# Patient Record
Sex: Male | Born: 1970 | Race: White | Hispanic: No | Marital: Married | Smoking: Current every day smoker
Health system: Southern US, Community
[De-identification: ages and names within clinical notes are randomized; demographics above are authoritative.]

## PROBLEM LIST (undated history)

## (undated) ENCOUNTER — Emergency Department: Disposition: A | Discharge status: Home / Self Care | Payer: 59

## (undated) DIAGNOSIS — J449 Chronic obstructive pulmonary disease, unspecified: Secondary | ICD-10-CM

## (undated) DIAGNOSIS — T8859XA Other complications of anesthesia, initial encounter: Secondary | ICD-10-CM

## (undated) DIAGNOSIS — E781 Pure hyperglyceridemia: Secondary | ICD-10-CM

## (undated) DIAGNOSIS — T4145XA Adverse effect of unspecified anesthetic, initial encounter: Secondary | ICD-10-CM

## (undated) DIAGNOSIS — Z9889 Other specified postprocedural states: Secondary | ICD-10-CM

## (undated) DIAGNOSIS — G8929 Other chronic pain: Secondary | ICD-10-CM

## (undated) DIAGNOSIS — G839 Paralytic syndrome, unspecified: Secondary | ICD-10-CM

## (undated) DIAGNOSIS — K219 Gastro-esophageal reflux disease without esophagitis: Secondary | ICD-10-CM

## (undated) DIAGNOSIS — R112 Nausea with vomiting, unspecified: Secondary | ICD-10-CM

## (undated) DIAGNOSIS — L409 Psoriasis, unspecified: Secondary | ICD-10-CM

## (undated) DIAGNOSIS — E041 Nontoxic single thyroid nodule: Secondary | ICD-10-CM

## (undated) DIAGNOSIS — M549 Dorsalgia, unspecified: Secondary | ICD-10-CM

## (undated) HISTORY — DX: Paralytic syndrome, unspecified: G83.9

## (undated) HISTORY — DX: Nontoxic single thyroid nodule: E04.1

## (undated) HISTORY — DX: Gastro-esophageal reflux disease without esophagitis: K21.9

## (undated) HISTORY — DX: Psoriasis, unspecified: L40.9

---

## 1993-05-05 HISTORY — PX: JOINT REPLACEMENT: SHX530

## 1993-05-05 HISTORY — PX: COSMETIC SURGERY: SHX468

## 2003-04-18 ENCOUNTER — Ambulatory Visit (HOSPITAL_BASED_OUTPATIENT_CLINIC_OR_DEPARTMENT_OTHER): Admission: RE | Admit: 2003-04-18 | Discharge: 2003-04-18 | Payer: Self-pay | Admitting: Orthopaedic Surgery

## 2003-05-06 HISTORY — PX: ROTATOR CUFF REPAIR: SHX139

## 2005-05-05 HISTORY — PX: CARPAL TUNNEL RELEASE: SHX101

## 2005-08-12 ENCOUNTER — Ambulatory Visit (HOSPITAL_BASED_OUTPATIENT_CLINIC_OR_DEPARTMENT_OTHER): Admission: RE | Admit: 2005-08-12 | Discharge: 2005-08-12 | Payer: Self-pay | Admitting: Orthopaedic Surgery

## 2006-01-31 ENCOUNTER — Emergency Department (HOSPITAL_COMMUNITY): Admission: EM | Admit: 2006-01-31 | Discharge: 2006-01-31 | Payer: Self-pay | Admitting: Emergency Medicine

## 2009-01-29 ENCOUNTER — Emergency Department (HOSPITAL_COMMUNITY): Admission: EM | Admit: 2009-01-29 | Discharge: 2009-01-29 | Payer: Self-pay | Admitting: Emergency Medicine

## 2009-02-05 ENCOUNTER — Ambulatory Visit (HOSPITAL_COMMUNITY): Admission: RE | Admit: 2009-02-05 | Discharge: 2009-02-05 | Payer: Self-pay | Admitting: Pulmonary Disease

## 2009-02-13 ENCOUNTER — Encounter (INDEPENDENT_AMBULATORY_CARE_PROVIDER_SITE_OTHER): Payer: Self-pay | Admitting: *Deleted

## 2009-04-05 ENCOUNTER — Ambulatory Visit: Payer: Self-pay | Admitting: Internal Medicine

## 2009-04-05 DIAGNOSIS — R1032 Left lower quadrant pain: Secondary | ICD-10-CM | POA: Insufficient documentation

## 2009-04-05 DIAGNOSIS — K219 Gastro-esophageal reflux disease without esophagitis: Secondary | ICD-10-CM

## 2009-04-05 DIAGNOSIS — R1012 Left upper quadrant pain: Secondary | ICD-10-CM

## 2009-04-05 DIAGNOSIS — R198 Other specified symptoms and signs involving the digestive system and abdomen: Secondary | ICD-10-CM

## 2009-04-05 DIAGNOSIS — R6881 Early satiety: Secondary | ICD-10-CM

## 2009-04-12 ENCOUNTER — Encounter: Payer: Self-pay | Admitting: Internal Medicine

## 2009-04-13 ENCOUNTER — Encounter: Payer: Self-pay | Admitting: Internal Medicine

## 2009-04-19 ENCOUNTER — Ambulatory Visit (HOSPITAL_COMMUNITY): Admission: RE | Admit: 2009-04-19 | Discharge: 2009-04-19 | Payer: Self-pay | Admitting: Internal Medicine

## 2009-04-19 ENCOUNTER — Ambulatory Visit: Payer: Self-pay | Admitting: Internal Medicine

## 2009-04-19 HISTORY — PX: ESOPHAGOGASTRODUODENOSCOPY: SHX1529

## 2009-04-19 HISTORY — PX: COLONOSCOPY: SHX174

## 2009-04-24 ENCOUNTER — Encounter: Payer: Self-pay | Admitting: Internal Medicine

## 2009-05-08 ENCOUNTER — Encounter: Payer: Self-pay | Admitting: Internal Medicine

## 2010-05-13 ENCOUNTER — Telehealth (INDEPENDENT_AMBULATORY_CARE_PROVIDER_SITE_OTHER): Payer: Self-pay

## 2010-06-04 NOTE — Letter (Signed)
Summary: Patient Notice, Colon Biopsy Results  [facility]  [address]   [place], [place] [address]   Phone: [phone]  Fax: [phone]       May 08, 2009   Adrian Lyons [address] [place], [place]  [address] January 09, 1971    Dear Mr. Garr,  I am pleased to inform you that the biopsies taken during your recent colonoscopy did not show any evidence of cancer or inflammation upon pathologic examination.  Additional information/recommendations:  Continue with the treatment plan as outlined on the day of your exam.  Please call us if you are having persistent problems or have questions about your condition that have not been fully answered at this time.  Sincerely,    R. Roetta Sessions MD  [facility] Ph: [phone]    Fax: [phone]   Appended Document: Patient Notice, Colon Biopsy Results mailed pt letter

## 2010-06-06 NOTE — Progress Notes (Signed)
Summary: protonix refill  Phone Note Call from Patient Call back at Home Phone [phone]   Caller: Patient Summary of Call: pt called (left voicemail) needs refill sent to [facility] for Protonix Initial call taken by: Hendricks Limes LPN,  May 13, 2010 1:43 PM     Appended Document: protonix refill    Prescriptions: PROTONIX 40 MG TBEC (PANTOPRAZOLE SODIUM) 1 by mouth daily for acid reflux  #31 x 11   Entered and Authorized by:   Joselyn Arrow FNP-BC   Signed by:   Joselyn Arrow FNP-BC on 05/13/2010   Method used:   Electronically to        [facility] (retail)       [address]       [place], [place]  [address]       Ph: [phone]       Fax: [phone]   RxID:   [id number]

## 2010-07-26 ENCOUNTER — Emergency Department (HOSPITAL_COMMUNITY)
Admission: EM | Admit: 2010-07-26 | Discharge: 2010-07-26 | Disposition: A | Discharge status: Home / Self Care | Payer: Worker's Compensation | Attending: Emergency Medicine | Admitting: Emergency Medicine

## 2010-07-26 ENCOUNTER — Emergency Department (HOSPITAL_COMMUNITY): Payer: Worker's Compensation

## 2010-07-26 DIAGNOSIS — Y990 Civilian activity done for income or pay: Secondary | ICD-10-CM | POA: Insufficient documentation

## 2010-07-26 DIAGNOSIS — W19XXXA Unspecified fall, initial encounter: Secondary | ICD-10-CM | POA: Insufficient documentation

## 2010-07-26 DIAGNOSIS — S335XXA Sprain of ligaments of lumbar spine, initial encounter: Secondary | ICD-10-CM | POA: Insufficient documentation

## 2010-07-26 DIAGNOSIS — M545 Low back pain, unspecified: Secondary | ICD-10-CM | POA: Insufficient documentation

## 2010-07-26 DIAGNOSIS — M129 Arthropathy, unspecified: Secondary | ICD-10-CM | POA: Insufficient documentation

## 2010-07-26 DIAGNOSIS — R209 Unspecified disturbances of skin sensation: Secondary | ICD-10-CM | POA: Insufficient documentation

## 2010-07-26 DIAGNOSIS — IMO0002 Reserved for concepts with insufficient information to code with codable children: Secondary | ICD-10-CM | POA: Insufficient documentation

## 2010-08-09 LAB — COMPREHENSIVE METABOLIC PANEL
AST: 22 U/L (ref 0–37)
Alkaline Phosphatase: 107 U/L (ref 39–117)
BUN: 9 mg/dL (ref 6–23)
CO2: 22 mEq/L (ref 19–32)
Chloride: 103 mEq/L (ref 96–112)
GFR calc non Af Amer: >60 mL/min (ref >60)
Sodium: 134 mEq/L — ABNORMAL LOW (ref 135–145)
Total Bilirubin: 1 mg/dL (ref 0.3–1.2)
Total Protein: 7.2 g/dL (ref 6.0–8.3)

## 2010-08-09 LAB — URINALYSIS, ROUTINE W REFLEX MICROSCOPIC
Protein, ur: NEGATIVE mg/dL
Specific Gravity, Urine: 1.005 (ref 1.005–1.030)
Urobilinogen, UA: 0.2 mg/dL (ref 0.0–1.0)

## 2010-08-09 LAB — CBC
Platelets: 218 10*3/uL (ref 150–400)
RDW: 13.5 % (ref 11.5–15.5)

## 2010-08-09 LAB — DIFFERENTIAL
Basophils Absolute: 0 10*3/uL (ref 0.0–0.1)
Basophils Relative: 0 % (ref 0–1)
Eosinophils Relative: 0 % (ref 0–5)
Lymphocytes Relative: 6 % — ABNORMAL LOW (ref 12–46)
Monocytes Absolute: 0.3 10*3/uL (ref 0.1–1.0)
Monocytes Relative: 2 % — ABNORMAL LOW (ref 3–12)
Neutrophils Relative %: 92 % — ABNORMAL HIGH (ref 43–77)

## 2010-08-09 LAB — LIPASE, BLOOD: Lipase: 13 U/L (ref 11–59)

## 2010-09-20 NOTE — Op Note (Signed)
NAME:  Adrian Lyons, Adrian Lyons                ACCOUNT NO.:  [id number]   MEDICAL RECORD NO.:  [id number]          PATIENT TYPE:  AMB   LOCATION:  DSC                          FACILITY:  [facility]   PHYSICIAN:  Lubertha Basque. Dalldorf, M.D.DATE OF BIRTH:  Sep 18, 1970   DATE OF PROCEDURE:  08/12/2005  DATE OF DISCHARGE:                                 OPERATIVE REPORT   PREOPERATIVE DIAGNOSIS:  Left carpal tunnel syndrome.   POSTOPERATIVE DIAGNOSIS:  Left carpal tunnel syndrome.   PROCEDURE:  Left carpal tunnel release.   ANESTHESIA:  General.   SURGEON:  Lubertha Basque. Jerl Santos, M.D.   INDICATIONS FOR PROCEDURE:  The patient is a 40 year old man with a long  history of left hand pain and numbness.  This has persisted despite bracing  and oral anti-inflammatories.  He has undergone a nerve conduction study  about a year ago which showed moderate to severe carpal tunnel syndrome.  He  has developed more constant symptoms at this point and he is offered a  carpal tunnel release.  Informed operative consent was obtained after  discussion of the possible complications of reaction to anesthesia,  infection, and neurovascular injury.   DESCRIPTION OF PROCEDURE:  The patient was taken to the operating suite  where a general anesthetic was applied without difficulty.  He was  positioned supine and prepped and draped in the normal sterile fashion.  After the administration of IV Kefzol, the left arm was elevated,  exsanguinated, and tourniquet inflated about the upper arm.  A small palmar  incision was made which was ulnar to the thenar flexion crease.  This did  not cross the wrist flexion crease.  Dissection was carried down through the  palmar fascia to expose the transverse carpal ligament.  This was then  incised.  This ligament was very thickened and seemed to be applying some  significant pressure to the underlying median nerve.  The release was taken  distally to the transverse arch vessels and proximally  through the distal  fascia of the forearm.  The wound was irrigated followed by reapproximation  of the skin alone with vertical mattresses of nylon.  The tourniquet was  deflated and the fingers became pink and warm immediately.  Some Marcaine  was injected about the incision site.  Adaptic was applied along with the  dry gauze dressing and a volar splint of plaster with the wrist in slight  extension.  Estimated blood loss and intraoperative fluids can be obtained  from the anesthesia records as can accurate tourniquet time.   DISPOSITION:  The patient was extubated in the operating room and taken to  the recovery room in stable addition.  Plans were for him to go home the  same-day and to followup in the office in less than one week. I will contact  him by phone tonight.     Lubertha Basque Jerl Santos, M.D.  Electronically Signed    PGD/MEDQ  D:  08/12/2005  T:  08/12/2005  Job:  [id number]

## 2010-09-20 NOTE — Op Note (Signed)
NAME:  Adrian Lyons, Adrian Lyons                          ACCOUNT NO.:  [id number]   MEDICAL RECORD NO.:  [id number]                   PATIENT TYPE:  AMB   LOCATION:  DSC                                  FACILITY:  [facility]   PHYSICIAN:  Lubertha Basque. Jerl Santos, M.D.             DATE OF BIRTH:  12-12-1970   DATE OF PROCEDURE:  DATE OF DISCHARGE:                                 OPERATIVE REPORT   PREOPERATIVE DIAGNOSES:  1. Left shoulder impingement.  2. Left shoulder clavicular spur.  3. Left shoulder partial rotator cuff tear.   POSTOPERATIVE DIAGNOSES:  1. Left shoulder impingement.  2. Left shoulder clavicular spur.  3. Left shoulder partial rotator cuff tear.   PROCEDURES:  1. Left shoulder arthroscopic chondroplasty.  2. Left shoulder partial clavilectomy.  3. Left shoulder arthroscopic debridement.   SURGEON:  Lubertha Basque. Jerl Santos, M.D.   ASSISTANT:  Lindwood Qua, P.A.   ANESTHESIA:  General.   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  This has persisted despite oral anti-inflammatories and a subacromial  injection which did afford him transient relief.  He has also been on an  exercise program.  He has undergone an MRI scan which shows findings  consistent with impingement and a spur under his AC joint.  He is offered  arthroscopy as he continues with pain with use of this arm and pain at rest.  Informed operative consent was obtained after discussion of options,  complications of, reaction to anesthesia and infection.   DESCRIPTION OF PROCEDURE:  The patient was taken to the operative suite  where general anesthetic was applied without difficulty.  He was positioned  in the beach chair position and prepped and draped in the normal sterile  fashion.  After administration of preoperative antibiotics, arthroscopy of  the left shoulder was performed through a total of two  portals.  Glenohumeral joint showed degenerative change in the biceps tendon.  All  labral structures appeared intact from below.  The rotator cuff also  appeared intact from below.  In the subacromial space, she was noted to have  significant bursitis and a partial thickness rotator cuff tear addressed  with the debridement.  He did have an unfavorable shape to his acromion, and  an acromioplasty was done with the bur in the lateral position followed by a  transfer of the bur to the posterior position.  He had a spur under the  distal clavicle, and this was removed without a formal ACD compression.  The  shoulder was clearly irrigated followed by reapproximation of the portals  loosely with nylon,.  Adaptic was placed over the wounds followed by a dry  gauze and tape.  Estimated blood loss and intraoperative fluids can be  obtained from the anesthesia records.   DISPOSITION:  The patient  was extubated in the operating room and taken to  the recovery room in stable condition.  Plans were for him to go home the  same day and follow up in the office in less than one week.  I will contact  him by phone tonight.                                               Lubertha Basque Jerl Santos, M.D.    PGD/MEDQ  D:  04/18/2003  T:  04/18/2003  Job:  [id number]

## 2011-07-01 ENCOUNTER — Telehealth: Payer: Self-pay

## 2011-07-01 MED ORDER — PANTOPRAZOLE SODIUM 40 MG PO TBEC: 40.0000 mg | DELAYED_RELEASE_TABLET | Freq: Every day | ORAL | Status: DC

## 2011-07-01 NOTE — Telephone Encounter (Signed)
Pt called he needs refill on protonix sent to [facility]. Informed pt that he needs an ov because he has not been seen in two years. Pt scheduled for 07/09/11 at 12:30 with KJ. He wants to know if he can have rx to last until ov.

## 2011-07-01 NOTE — Telephone Encounter (Signed)
#  30 without refills given.

## 2011-07-08 ENCOUNTER — Encounter: Payer: Self-pay | Admitting: Internal Medicine

## 2011-07-09 ENCOUNTER — Ambulatory Visit (INDEPENDENT_AMBULATORY_CARE_PROVIDER_SITE_OTHER): Payer: Self-pay | Admitting: Urgent Care

## 2011-07-09 ENCOUNTER — Encounter: Payer: Self-pay | Admitting: Urgent Care

## 2011-07-09 VITALS — BP 133/81 | HR 78 | Ht 68.0 in | Wt 168.0 lb

## 2011-07-09 DIAGNOSIS — K219 Gastro-esophageal reflux disease without esophagitis: Secondary | ICD-10-CM

## 2011-07-09 MED ORDER — PANTOPRAZOLE SODIUM 40 MG PO TBEC: 40.0000 mg | DELAYED_RELEASE_TABLET | Freq: Every day | ORAL | Status: DC

## 2011-07-09 NOTE — Patient Instructions (Signed)
Continue pantoprazole 40 mg daily Follow up in 2 years or sooner if needed

## 2011-07-09 NOTE — Assessment & Plan Note (Signed)
Well controlled on pantoprazole 40 mg daily. Office visit in 2 years or sooner if needed

## 2011-07-09 NOTE — Progress Notes (Signed)
Faxed to PCP

## 2011-07-09 NOTE — Progress Notes (Signed)
Primary Care Physician:  Fredirick Maudlin, MD, MD Primary Gastroenterologist:  Dr. Jena Gauss  Chief Complaint  Patient presents with  . Medication Refill  . Gastrophageal Reflux    HPI:  Adrian Lyons is a 41 y.o. male here for follow up for GERD. He is doing well on pantoprazole 40 mg daily. He has tried trial of PPI with poor results.  Denies any upper GI symptoms including heartburn, indigestion, nausea, vomiting, dysphagia, odynophagia or anorexia. Denies any lower GI symptoms including constipation, diarrhea, rectal bleeding, melena or weight loss.  Past Medical History  Diagnosis Date  . GERD (gastroesophageal reflux disease)   . Psoriasis   . Thyroid cyst   . Paralysis     right arm s/p trauma    Past Surgical History  Procedure Date  . Colonoscopy 04/19/09    melanosis coli  . Esophagogastroduodenoscopy 04/19/09    small  hiatal hernia, erosive reflux esophagitis  . Carpal tunnel release 2007    left hand  . Rotator cuff repair 2005    left  . Cosmetic surgery     Current Outpatient Prescriptions  Medication Sig Dispense Refill  . pantoprazole (PROTONIX) 40 MG tablet Take 1 tablet (40 mg total) by mouth daily.  31 tablet  11    Allergies as of 07/09/2011 - Review Complete 07/09/2011  Allergen Reaction Noted  . Codeine Itching   . Penicillins Other (See Comments)   . Vicodin (hydrocodone-acetaminophen) Nausea And Vomiting 07/09/2011    Review of Systems: Gen: Denies any fever, chills, sweats, anorexia, fatigue, weakness, malaise, weight loss, and sleep disorder CV: Denies chest pain, angina, palpitations, syncope, orthopnea, PND, peripheral edema, and claudication. Resp: Denies dyspnea at rest, dyspnea with exercise, cough, sputum, wheezing, coughing up blood, and pleurisy. GI: Denies vomiting blood, jaundice, and fecal incontinence.   Denies dysphagia or odynophagia. Derm: Denies rash, itching, dry skin, hives, moles, warts, or unhealing ulcers.  Psych: Denies  depression, anxiety, memory loss, suicidal ideation, hallucinations, paranoia, and confusion. Heme: Denies bruising, bleeding, and enlarged lymph nodes.  Physical Exam: BP 133/81  Pulse 78  Ht 5\' 8"  (1.727 m)  Wt 168 lb (76.204 kg)  BMI 25.54 kg/m2 General:   Alert,  Well-developed, well-nourished, pleasant and cooperative in NAD Eyes:  Sclera clear, no icterus.   Conjunctiva pink. Mouth:  No deformity or lesions, oropharynx pink and moist. Neck:  Supple; no masses or thyromegaly. Heart:  Regular rate and rhythm; no murmurs, clicks, rubs,  or gallops. Abdomen:  Normal bowel sounds.  No bruits.  Soft, non-tender and non-distended without masses, hepatosplenomegaly or hernias noted.  No guarding or rebound tenderness.   Rectal:  Deferred. Msk:  Symmetrical withounormt gross deformities.  Pulses:  Normal pulses noted. Extremities:  No clubbing or edema. Neurologic:  Alert and oriented x4;  grossly normal neurologically. Skin:  Intact without significant lesions or rashes.
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
## 2011-07-10 NOTE — Consult Note (Deleted)
  The note originally documented on this encounter has been moved the the encounter in which it belongs.  

## 2011-07-14 ENCOUNTER — Encounter (HOSPITAL_COMMUNITY): Payer: Self-pay | Admitting: Pharmacy Technician

## 2011-07-16 ENCOUNTER — Encounter (HOSPITAL_COMMUNITY): Payer: Self-pay

## 2011-07-16 ENCOUNTER — Encounter (HOSPITAL_COMMUNITY)
Admission: RE | Admit: 2011-07-16 | Discharge: 2011-07-16 | Disposition: A | Discharge status: Home / Self Care | Payer: BC Managed Care – PPO | Source: Ambulatory Visit | Attending: General Surgery | Admitting: General Surgery

## 2011-07-16 HISTORY — DX: Other specified postprocedural states: Z98.890

## 2011-07-16 HISTORY — DX: Other specified postprocedural states: R11.2

## 2011-07-16 HISTORY — DX: Adverse effect of unspecified anesthetic, initial encounter: T41.45XA

## 2011-07-16 HISTORY — DX: Dorsalgia, unspecified: M54.9

## 2011-07-16 HISTORY — DX: Other chronic pain: G89.29

## 2011-07-16 HISTORY — DX: Pure hyperglyceridemia: E78.1

## 2011-07-16 HISTORY — DX: Chronic obstructive pulmonary disease, unspecified: J44.9

## 2011-07-16 HISTORY — DX: Other complications of anesthesia, initial encounter: T88.59XA

## 2011-07-16 LAB — CBC
MCHC: 35 g/dL (ref 30.0–36.0)
Platelets: 280 10*3/uL (ref 150–400)
RDW: 13.7 % (ref 11.5–15.5)
WBC: 20.6 10*3/uL — ABNORMAL HIGH (ref 4.0–10.5)

## 2011-07-16 LAB — BASIC METABOLIC PANEL
BUN: 9 mg/dL (ref 6–23)
Calcium: 10.4 mg/dL (ref 8.4–10.5)
GFR calc non Af Amer: >90 mL/min (ref >90)

## 2011-07-16 LAB — DIFFERENTIAL
Lymphocytes Relative: 14 % (ref 12–46)
Neutro Abs: 16.2 10*3/uL — ABNORMAL HIGH (ref 1.7–7.7)
Neutrophils Relative %: 80 % — ABNORMAL HIGH (ref 43–77)

## 2011-07-16 NOTE — Progress Notes (Signed)
Dr. Malvin Johns notified of elevated WBC. Pt called to cancel surgery until care team can determine the reason for elevated WBC per MD order. Will see MD in office Monday. Kim Faint notified of cancellation.

## 2011-07-16 NOTE — Patient Instructions (Signed)
20 NASER SCHULD  07/16/2011   Your procedure is scheduled on:  Monday, 07/21/11  Report to [facility] at [place] [facility].  Call this number if you have problems the morning of surgery: [phone]   Remember:   Do not eat food:After Midnight.  May have clear liquids:until Midnight .  Clear liquids include soda, tea, black coffee, apple or grape juice, broth.  Take these medicines the morning of surgery with A SIP OF WATER: flexeril if needed   Do not wear jewelry, make-up or nail polish.  Do not wear lotions, powders, or perfumes. You may wear deodorant.  Do not shave 48 hours prior to surgery.  Do not bring valuables to the hospital.  Contacts, dentures or bridgework may not be worn into surgery.  Leave suitcase in the car. After surgery it may be brought to your room.  For patients admitted to the hospital, checkout time is 11:00 AM the day of discharge.   Patients discharged the day of surgery will not be allowed to drive home.  Name and phone number of your driver: driver  Special Instructions: CHG Shower Use Special Wash: 1/2 bottle night before surgery and 1/2 bottle morning of surgery.   Please read over the following fact sheets that you were given: Pain Booklet, MRSA Information, Surgical Site Infection Prevention, Anesthesia Post-op Instructions and Care and Recovery After Surgery   Hernia Repair Care After These instructions give you information on caring for yourself after your procedure. Your doctor may also give you more specific instructions. Call your doctor if you have any problems or questions after your procedure. HOME CARE   You may have changes in your poops (bowel movements).   You may have loose or watery poop (diarrhea).   You may be not able to poop.   Your bowels will slowly get back to normal.   Do not eat any food that makes you sick to your stomach (nauseous). Eat small meals 4 to 6 times a day instead of 3 large ones.   Do not drink pop. It will give you  gas.   Do not drink alcohol.   Do not lift anything heavier than 10 pounds. This is about the weight of a gallon of milk.   Do not do anything that makes you very tired for at least 6 weeks.   Do not get your wound wet for 2 days.   You may take a sponge bath during this time.   After 2 days you may take a shower. Gently pat your surgical cut (incision) dry with a towel. Do not rub it.   For men: You may have been given an athletic supporter (scrotal support) before you left the hospital. It holds your scrotum and testicles closer to your body so there is no strain on your wound. Wear the supporter until your doctor tells you that you do not need it anymore.  GET HELP RIGHT AWAY IF:  You have watery poop, or cannot poop for more than 3 days.   You feel sick to your stomach or throw up (vomit) more than 2 or 3 times.   You have temperature by mouth above 102 F (38.9 C).   You see redness or puffiness (swelling) around your wound.   You see yellowish white fluid (pus) coming from your wound.   You see a bulge or bump in your lower belly (abdomen) or near your groin.   You develop a rash, trouble breathing, or any other  symptoms from medicines taken.  MAKE SURE YOU:  Understand these instructions.   Will watch your condition.   Will get help right away if your are not doing well or get worse.  Document Released: 04/03/2008 Document Revised: 04/10/2011 Document Reviewed: 04/03/2008 [facility] Patient Information 2012 [place], [place].

## 2011-07-21 ENCOUNTER — Encounter (HOSPITAL_COMMUNITY): Admission: RE | Payer: Self-pay | Source: Ambulatory Visit

## 2011-07-21 ENCOUNTER — Ambulatory Visit (HOSPITAL_COMMUNITY): Admission: RE | Admit: 2011-07-21 | Payer: BC Managed Care – PPO | Source: Ambulatory Visit | Admitting: General Surgery

## 2011-07-21 SURGERY — REPAIR, HERNIA, INGUINAL, ADULT
Anesthesia: General | Laterality: Left

## 2011-07-24 ENCOUNTER — Encounter (HOSPITAL_COMMUNITY): Payer: Self-pay

## 2011-07-24 ENCOUNTER — Encounter (HOSPITAL_COMMUNITY)
Admission: RE | Admit: 2011-07-24 | Discharge: 2011-07-24 | Disposition: A | Discharge status: Home / Self Care | Payer: BC Managed Care – PPO | Source: Ambulatory Visit | Attending: General Surgery | Admitting: General Surgery

## 2011-07-24 NOTE — Patient Instructions (Signed)
20 Adrian Lyons  07/24/2011   Your procedure is scheduled on:  07/28/2011  Report to [facility] at 6:15 AM.  Call this number if you have problems the morning of surgery: [phone]   Remember:   Do not eat food:After Midnight.  May have clear liquids:until Midnight .  Clear liquids include soda, tea, black coffee, apple or grape juice, broth.  Take these medicines the morning of surgery with A SIP OF WATER: Protonix   Do not wear jewelry, make-up or nail polish.  Do not wear lotions, powders, or perfumes. You may wear deodorant.  Do not shave 48 hours prior to surgery.  Do not bring valuables to the hospital.  Contacts, dentures or bridgework may not be worn into surgery.  Leave suitcase in the car. After surgery it may be brought to your room.  For patients admitted to the hospital, checkout time is 11:00 AM the day of discharge.   Patients discharged the day of surgery will not be allowed to drive home.  Name and phone number of your driver: Martie Lee Fiance  Special Instructions: CHG Shower Use Special Wash: 1/2 bottle night before surgery and 1/2 bottle morning of surgery.   Please read over the following fact sheets that you were given: Care and Recovery After Surgery

## 2011-07-28 ENCOUNTER — Encounter (HOSPITAL_COMMUNITY): Payer: Self-pay | Admitting: Anesthesiology

## 2011-07-28 ENCOUNTER — Encounter (HOSPITAL_COMMUNITY)
Admission: RE | Disposition: A | Discharge status: Home / Self Care | Payer: Self-pay | Source: Ambulatory Visit | Attending: General Surgery

## 2011-07-28 ENCOUNTER — Encounter (HOSPITAL_COMMUNITY): Payer: Self-pay | Admitting: *Deleted

## 2011-07-28 ENCOUNTER — Ambulatory Visit (HOSPITAL_COMMUNITY): Payer: BC Managed Care – PPO | Admitting: Anesthesiology

## 2011-07-28 ENCOUNTER — Ambulatory Visit (HOSPITAL_COMMUNITY)
Admission: RE | Admit: 2011-07-28 | Discharge: 2011-07-29 | Disposition: A | Discharge status: Home / Self Care | Payer: BC Managed Care – PPO | Source: Ambulatory Visit | Attending: General Surgery | Admitting: General Surgery

## 2011-07-28 DIAGNOSIS — R1032 Left lower quadrant pain: Secondary | ICD-10-CM

## 2011-07-28 DIAGNOSIS — J4489 Other specified chronic obstructive pulmonary disease: Secondary | ICD-10-CM | POA: Insufficient documentation

## 2011-07-28 DIAGNOSIS — J449 Chronic obstructive pulmonary disease, unspecified: Secondary | ICD-10-CM | POA: Insufficient documentation

## 2011-07-28 DIAGNOSIS — K4090 Unilateral inguinal hernia, without obstruction or gangrene, not specified as recurrent: Principal | ICD-10-CM | POA: Insufficient documentation

## 2011-07-28 DIAGNOSIS — R198 Other specified symptoms and signs involving the digestive system and abdomen: Secondary | ICD-10-CM

## 2011-07-28 DIAGNOSIS — R209 Unspecified disturbances of skin sensation: Secondary | ICD-10-CM | POA: Insufficient documentation

## 2011-07-28 DIAGNOSIS — K219 Gastro-esophageal reflux disease without esophagitis: Secondary | ICD-10-CM

## 2011-07-28 DIAGNOSIS — Z01812 Encounter for preprocedural laboratory examination: Secondary | ICD-10-CM | POA: Insufficient documentation

## 2011-07-28 DIAGNOSIS — R6881 Early satiety: Secondary | ICD-10-CM

## 2011-07-28 DIAGNOSIS — R1012 Left upper quadrant pain: Secondary | ICD-10-CM

## 2011-07-28 HISTORY — PX: INGUINAL HERNIA REPAIR: SHX194

## 2011-07-28 SURGERY — REPAIR, HERNIA, INGUINAL, ADULT
Anesthesia: General | Site: Abdomen | Laterality: Left | Wound class: Clean

## 2011-07-28 MED ORDER — SCOPOLAMINE 1 MG/3DAYS TD PT72
1.0000 | MEDICATED_PATCH | Freq: Once | TRANSDERMAL | Status: DC
Start: 2011-07-28 — End: 2011-07-28
  Administered 2011-07-28: 1.5 mg via TRANSDERMAL

## 2011-07-28 MED ORDER — DOCUSATE SODIUM 100 MG PO CAPS
100.0000 mg | ORAL_CAPSULE | Freq: Two times a day (BID) | ORAL | Status: DC
  Administered 2011-07-28 – 2011-07-29 (×3): 100 mg via ORAL
  Filled 2011-07-28 (×3): qty 1

## 2011-07-28 MED ORDER — ROCURONIUM BROMIDE 100 MG/10ML IV SOLN
INTRAVENOUS | Status: DC | PRN
  Administered 2011-07-28: 35 mg via INTRAVENOUS
  Administered 2011-07-28: 10 mg via INTRAVENOUS

## 2011-07-28 MED ORDER — MIDAZOLAM HCL 2 MG/2ML IJ SOLN
INTRAMUSCULAR | Status: AC
  Filled 2011-07-28: qty 2

## 2011-07-28 MED ORDER — PROPOFOL 10 MG/ML IV BOLUS
INTRAVENOUS | Status: DC | PRN
  Administered 2011-07-28: 170 mg via INTRAVENOUS

## 2011-07-28 MED ORDER — BUPIVACAINE HCL (PF) 0.5 % IJ SOLN
INTRAMUSCULAR | Status: DC | PRN
  Administered 2011-07-28: 10 mL

## 2011-07-28 MED ORDER — GLYCOPYRROLATE 0.2 MG/ML IJ SOLN
INTRAMUSCULAR | Status: AC
  Filled 2011-07-28: qty 1

## 2011-07-28 MED ORDER — FENTANYL CITRATE 0.05 MG/ML IJ SOLN
INTRAMUSCULAR | Status: AC
  Filled 2011-07-28: qty 2

## 2011-07-28 MED ORDER — PANTOPRAZOLE SODIUM 40 MG PO TBEC
40.0000 mg | DELAYED_RELEASE_TABLET | Freq: Every day | ORAL | Status: DC
  Administered 2011-07-29: 40 mg via ORAL
  Filled 2011-07-28 (×2): qty 1

## 2011-07-28 MED ORDER — NICOTINE 21 MG/24HR TD PT24
21.0000 mg | MEDICATED_PATCH | Freq: Every day | TRANSDERMAL | Status: DC
  Administered 2011-07-28 – 2011-07-29 (×2): 21 mg via TRANSDERMAL
  Filled 2011-07-28 (×2): qty 1

## 2011-07-28 MED ORDER — CYCLOBENZAPRINE HCL 10 MG PO TABS
10.0000 mg | ORAL_TABLET | Freq: Three times a day (TID) | ORAL | Status: DC | PRN
  Administered 2011-07-29: 10 mg via ORAL
  Filled 2011-07-28: qty 1

## 2011-07-28 MED ORDER — KETOROLAC TROMETHAMINE 30 MG/ML IJ SOLN
30.0000 mg | Freq: Four times a day (QID) | INTRAMUSCULAR | Status: DC | PRN
  Administered 2011-07-28 – 2011-07-29 (×3): 30 mg via INTRAVENOUS
  Filled 2011-07-28 (×3): qty 1

## 2011-07-28 MED ORDER — ONDANSETRON HCL 4 MG/2ML IJ SOLN
INTRAMUSCULAR | Status: AC
  Administered 2011-07-28: 4 mg via INTRAVENOUS
  Filled 2011-07-28: qty 2

## 2011-07-28 MED ORDER — MIDAZOLAM HCL 2 MG/2ML IJ SOLN
INTRAMUSCULAR | Status: AC
  Administered 2011-07-28: 2 mg via INTRAVENOUS
  Filled 2011-07-28: qty 2

## 2011-07-28 MED ORDER — ONDANSETRON HCL 4 MG/2ML IJ SOLN
4.0000 mg | Freq: Once | INTRAMUSCULAR | Status: AC
  Administered 2011-07-28: 4 mg via INTRAVENOUS

## 2011-07-28 MED ORDER — NEOSTIGMINE METHYLSULFATE 1 MG/ML IJ SOLN
INTRAMUSCULAR | Status: DC | PRN
  Administered 2011-07-28: 3 mg via INTRAVENOUS

## 2011-07-28 MED ORDER — ROCURONIUM BROMIDE 50 MG/5ML IV SOLN
INTRAVENOUS | Status: AC
  Filled 2011-07-28: qty 1

## 2011-07-28 MED ORDER — CIPROFLOXACIN IN D5W 400 MG/200ML IV SOLN
INTRAVENOUS | Status: AC
  Administered 2011-07-28: 400 mg via INTRAVENOUS
  Filled 2011-07-28: qty 200

## 2011-07-28 MED ORDER — MIDAZOLAM HCL 2 MG/2ML IJ SOLN
1.0000 mg | INTRAMUSCULAR | Status: DC | PRN
  Administered 2011-07-28 (×2): 2 mg via INTRAVENOUS

## 2011-07-28 MED ORDER — ONDANSETRON HCL 4 MG/2ML IJ SOLN
4.0000 mg | Freq: Once | INTRAMUSCULAR | Status: AC | PRN
  Administered 2011-07-28: 4 mg via INTRAVENOUS

## 2011-07-28 MED ORDER — CIPROFLOXACIN IN D5W 400 MG/200ML IV SOLN
INTRAVENOUS | Status: DC | PRN
  Administered 2011-07-28: 400 mg via INTRAVENOUS

## 2011-07-28 MED ORDER — BUPIVACAINE HCL (PF) 0.5 % IJ SOLN
INTRAMUSCULAR | Status: AC
  Filled 2011-07-28: qty 30

## 2011-07-28 MED ORDER — SODIUM CHLORIDE 0.9 % IJ SOLN
INTRAMUSCULAR | Status: AC
  Filled 2011-07-28: qty 3

## 2011-07-28 MED ORDER — DEXAMETHASONE SODIUM PHOSPHATE 4 MG/ML IJ SOLN
4.0000 mg | Freq: Once | INTRAMUSCULAR | Status: AC
  Administered 2011-07-28: 4 mg via INTRAVENOUS

## 2011-07-28 MED ORDER — FENTANYL CITRATE 0.05 MG/ML IJ SOLN
25.0000 ug | INTRAMUSCULAR | Status: DC | PRN
  Administered 2011-07-28 (×2): 50 ug via INTRAVENOUS

## 2011-07-28 MED ORDER — 0.9 % SODIUM CHLORIDE (POUR BTL) OPTIME: TOPICAL | Status: DC | PRN

## 2011-07-28 MED ORDER — LORAZEPAM 1 MG PO TABS
1.0000 mg | ORAL_TABLET | Freq: Every day | ORAL | Status: DC
  Administered 2011-07-28: 1 mg via ORAL
  Filled 2011-07-28: qty 1

## 2011-07-28 MED ORDER — FENTANYL CITRATE 0.05 MG/ML IJ SOLN
INTRAMUSCULAR | Status: AC
  Administered 2011-07-28: 50 ug via INTRAVENOUS
  Filled 2011-07-28: qty 2

## 2011-07-28 MED ORDER — GLYCOPYRROLATE 0.2 MG/ML IJ SOLN
INTRAMUSCULAR | Status: DC | PRN
  Administered 2011-07-28: .4 mg via INTRAVENOUS

## 2011-07-28 MED ORDER — VITAMIN D 1000 UNITS PO TABS
1000.0000 [IU] | ORAL_TABLET | Freq: Every day | ORAL | Status: DC
  Administered 2011-07-28 – 2011-07-29 (×2): 1000 [IU] via ORAL
  Filled 2011-07-28 (×2): qty 1

## 2011-07-28 MED ORDER — DEXAMETHASONE SODIUM PHOSPHATE 4 MG/ML IJ SOLN
INTRAMUSCULAR | Status: AC
  Administered 2011-07-28: 4 mg via INTRAVENOUS
  Filled 2011-07-28: qty 1

## 2011-07-28 MED ORDER — PROPOFOL 10 MG/ML IV EMUL
INTRAVENOUS | Status: AC
  Filled 2011-07-28: qty 20

## 2011-07-28 MED ORDER — LIDOCAINE HCL 1 % IJ SOLN
INTRAMUSCULAR | Status: DC | PRN
  Administered 2011-07-28: 25 mg via INTRADERMAL

## 2011-07-28 MED ORDER — CIPROFLOXACIN IN D5W 400 MG/200ML IV SOLN
400.0000 mg | Freq: Two times a day (BID) | INTRAVENOUS | Status: DC
  Administered 2011-07-28: 400 mg via INTRAVENOUS

## 2011-07-28 MED ORDER — BACITRACIN 50000 UNITS IM SOLR
INTRAMUSCULAR | Status: AC
  Filled 2011-07-28: qty 1

## 2011-07-28 MED ORDER — LACTATED RINGERS IV SOLN
INTRAVENOUS | Status: DC
  Administered 2011-07-28: 1000 mL via INTRAVENOUS

## 2011-07-28 MED ORDER — SCOPOLAMINE 1 MG/3DAYS TD PT72
MEDICATED_PATCH | TRANSDERMAL | Status: AC
  Administered 2011-07-28: 1.5 mg via TRANSDERMAL
  Filled 2011-07-28: qty 1

## 2011-07-28 MED ORDER — SODIUM CHLORIDE 0.9 % IR SOLN
Status: DC | PRN
  Administered 2011-07-28: 09:00:00

## 2011-07-28 MED ORDER — LIDOCAINE HCL (PF) 1 % IJ SOLN
INTRAMUSCULAR | Status: AC
  Filled 2011-07-28: qty 5

## 2011-07-28 MED ORDER — STERILE WATER FOR IRRIGATION IR SOLN
Status: DC | PRN
  Administered 2011-07-28: 2000 mL

## 2011-07-28 MED ORDER — SODIUM CHLORIDE 0.9 % IR SOLN
Status: DC | PRN
  Administered 2011-07-28: 1000 mL

## 2011-07-28 MED ORDER — POTASSIUM CHLORIDE IN NACL 20-0.9 MEQ/L-% IV SOLN
INTRAVENOUS | Status: DC
  Administered 2011-07-28 – 2011-07-29 (×3): via INTRAVENOUS

## 2011-07-28 MED ORDER — ONDANSETRON HCL 4 MG/2ML IJ SOLN
4.0000 mg | Freq: Four times a day (QID) | INTRAMUSCULAR | Status: DC | PRN
  Administered 2011-07-29: 4 mg via INTRAVENOUS
  Filled 2011-07-28: qty 2

## 2011-07-28 MED ORDER — FENTANYL CITRATE 0.05 MG/ML IJ SOLN
INTRAMUSCULAR | Status: DC | PRN
  Administered 2011-07-28 (×6): 50 ug via INTRAVENOUS

## 2011-07-28 MED ORDER — MIDAZOLAM HCL 5 MG/5ML IJ SOLN
INTRAMUSCULAR | Status: DC | PRN
  Administered 2011-07-28: 2 mg via INTRAVENOUS

## 2011-07-28 SURGICAL SUPPLY — 47 items
ATTRACTOMAT 16X20 MAGNETIC DRP (DRAPES) ×2 IMPLANT
BAG HAMPER (MISCELLANEOUS) ×2 IMPLANT
CLOTH BEACON ORANGE TIMEOUT ST (SAFETY) ×2 IMPLANT
COVER SURGICAL LIGHT HANDLE (MISCELLANEOUS) ×4 IMPLANT
DECANTER SPIKE VIAL GLASS SM (MISCELLANEOUS) ×2 IMPLANT
DRAIN PENROSE 12X.25 LTX STRL (MISCELLANEOUS) ×2 IMPLANT
DRSG MEPILEX BORDER 4X8 (GAUZE/BANDAGES/DRESSINGS) ×2 IMPLANT
ELECT REM PT RETURN 9FT ADLT (ELECTROSURGICAL) ×2
ELECTRODE REM PT RTRN 9FT ADLT (ELECTROSURGICAL) ×1 IMPLANT
FORMALIN 10 PREFIL 120ML (MISCELLANEOUS) IMPLANT
GLOVE ECLIPSE 6.5 STRL STRAW (GLOVE) ×4 IMPLANT
GLOVE EXAM NITRILE MD LF STRL (GLOVE) ×2 IMPLANT
GLOVE INDICATOR 7.0 STRL GRN (GLOVE) ×4 IMPLANT
GLOVE SKINSENSE NS SZ7.0 (GLOVE) ×1
GLOVE SKINSENSE STRL SZ7.0 (GLOVE) ×1 IMPLANT
GOWN STRL REIN XL XLG (GOWN DISPOSABLE) ×8 IMPLANT
INST SET MINOR GENERAL (KITS) ×2 IMPLANT
KIT ROOM TURNOVER APOR (KITS) ×2 IMPLANT
MANIFOLD NEPTUNE II (INSTRUMENTS) ×2 IMPLANT
MESH MARLEX PLUG MEDIUM (Mesh General) ×2 IMPLANT
NEEDLE HYPO 18GX1.5 BLUNT FILL (NEEDLE) ×2 IMPLANT
NEEDLE HYPO 25X1 1.5 SAFETY (NEEDLE) ×2 IMPLANT
NS IRRIG 1000ML POUR BTL (IV SOLUTION) ×2 IMPLANT
PACK MINOR (CUSTOM PROCEDURE TRAY) ×2 IMPLANT
PAD ARMBOARD 7.5X6 YLW CONV (MISCELLANEOUS) ×2 IMPLANT
SET BASIN LINEN APH (SET/KITS/TRAYS/PACK) ×2 IMPLANT
SOL PREP PROV IODINE SCRUB 4OZ (MISCELLANEOUS) ×2 IMPLANT
SPONGE GAUZE 4X4 12PLY (GAUZE/BANDAGES/DRESSINGS) ×2 IMPLANT
SPONGE INTESTINAL PEANUT (DISPOSABLE) ×2 IMPLANT
SPONGE LAP 18X18 X RAY DECT (DISPOSABLE) ×2 IMPLANT
STAPLER VISISTAT 35W (STAPLE) ×2 IMPLANT
SUT NOVA NAB GS-22 2 2-0 T-19 (SUTURE) IMPLANT
SUT NUROLON NAB CT 2 2-0 18IN (SUTURE) ×2 IMPLANT
SUT PROLENE 0 CT 1 CR/8 (SUTURE) IMPLANT
SUT PROLENE 2 0 CR (SUTURE) ×2 IMPLANT
SUT SILK 2 0 (SUTURE) ×2
SUT SILK 2-0 18XBRD TIE 12 (SUTURE) ×1 IMPLANT
SUT VIC AB 0 CT1 27 (SUTURE)
SUT VIC AB 0 CT1 27XBRD ANTBC (SUTURE) IMPLANT
SUT VIC AB 3-0 SH 27 (SUTURE) ×2
SUT VIC AB 3-0 SH 27X BRD (SUTURE) ×1 IMPLANT
SUT VICRYL AB 3 0 TIES (SUTURE) ×2 IMPLANT
SYR BULB IRRIGATION 50ML (SYRINGE) ×2 IMPLANT
SYR CONTROL 10ML LL (SYRINGE) ×2 IMPLANT
SYRINGE 10CC LL (SYRINGE) ×2 IMPLANT
TRAY FOLEY CATH 14FR (SET/KITS/TRAYS/PACK) ×2 IMPLANT
WATER STERILE IRR 1000ML POUR (IV SOLUTION) ×4 IMPLANT

## 2011-07-28 NOTE — Brief Op Note (Signed)
07/28/2011  9:59 AM  PATIENT:  Gilford Raid  41 y.o. male  PRE-OPERATIVE DIAGNOSIS:  left inguinal hernia  POST-OPERATIVE DIAGNOSIS:  direct and indirect left inguinal hernia  PROCEDURE:  Procedure(s) (LRB): HERNIA REPAIR INGUINAL ADULT (Left)  SURGEON:  Surgeon(s) and Role:    * Marlane Hatcher, MD - Primary  PHYSICIAN ASSISTANT:   ASSISTANTS: none   ANESTHESIA:   general  EBL:  Total I/O In: 800 [I.V.:800] Out: 750 [Urine:750]  BLOOD ADMINISTERED:none  DRAINS: none   LOCAL MEDICATIONS USED:  MARCAINE 10 cc    SPECIMEN:  Source of Specimen:  Left inguinal hernia sac and lipoma of cord.  DISPOSITION OF SPECIMEN:  PATHOLOGY  COUNTS:  YES  TOURNIQUET:  * No tourniquets in log *  DICTATION: .Other Dictation: Dictation Number OR dict. # [id number]  PLAN OF CARE: Admit for overnight observation  PATIENT DISPOSITION:  PACU - hemodynamically stable.   Delay start of Pharmacological VTE agent (>24hrs) due to surgical blood loss or risk of bleeding: not applicable

## 2011-07-28 NOTE — Anesthesia Procedure Notes (Signed)
Procedure Name: Intubation Date/Time: 07/28/2011 8:07 AM Performed by: Despina Hidden Pre-anesthesia Checklist: Emergency Drugs available, Suction available, Patient being monitored and Patient identified Patient Re-evaluated:Patient Re-evaluated prior to inductionOxygen Delivery Method: Circle system utilized Preoxygenation: Pre-oxygenation with 100% oxygen Intubation Type: Cricoid Pressure applied and Rapid sequence Ventilation: Mask ventilation with difficulty Laryngoscope Size: Mac and 3 Grade View: Grade I Tube type: Oral Tube size: 8.0 mm Number of attempts: 1 Airway Equipment and Method: Stylet Placement Confirmation: ETT inserted through vocal cords under direct vision,  positive ETCO2 and breath sounds checked- equal and bilateral Secured at: 22 cm Tube secured with: Tape Dental Injury: Teeth and Oropharynx as per pre-operative assessment

## 2011-07-28 NOTE — Progress Notes (Signed)
UR Chart Review Completed-OIB confirmed

## 2011-07-28 NOTE — Anesthesia Preprocedure Evaluation (Addendum)
Anesthesia Evaluation  Patient identified by MRN, date of birth, ID band Patient awake    Reviewed: Allergy & Precautions, H&P , NPO status , Patient's Chart, lab work & pertinent test results  History of Anesthesia Complications (+) PONV  Airway Mallampati: II  Neck ROM: Full    Dental  (+) Edentulous Upper   Pulmonary COPDCurrent Smoker (am cough),  breath sounds clear to auscultation        Cardiovascular Rhythm:Regular Rate:Normal     Neuro/Psych    GI/Hepatic GERD-  Medicated and Controlled,  Endo/Other    Renal/GU      Musculoskeletal   Abdominal   Peds  Hematology   Anesthesia Other Findings   Reproductive/Obstetrics                           Anesthesia Physical Anesthesia Plan  ASA: II  Anesthesia Plan: General   Post-op Pain Management:    Induction: Intravenous, Rapid sequence and Cricoid pressure planned  Airway Management Planned: Oral ETT  Additional Equipment:   Intra-op Plan:   Post-operative Plan: Extubation in OR  Informed Consent: I have reviewed the patients History and Physical, chart, labs and discussed the procedure including the risks, benefits and alternatives for the proposed anesthesia with the patient or authorized representative who has indicated his/her understanding and acceptance.     Plan Discussed with:   Anesthesia Plan Comments:        Anesthesia Quick Evaluation

## 2011-07-28 NOTE — Anesthesia Postprocedure Evaluation (Signed)
  Anesthesia Post-op Note  Patient: Adrian Lyons  Procedure(s) Performed: Procedure(s) (LRB): HERNIA REPAIR INGUINAL ADULT (Left)  Patient Location: PACU  Anesthesia Type: General  Level of Consciousness: awake, alert , oriented and patient cooperative  Airway and Oxygen Therapy: Patient Spontanous Breathing  Post-op Pain: 3 /10  Post-op Assessment: Post-op Vital signs reviewed, Patient's Cardiovascular Status Stable, Respiratory Function Stable, Patent Airway, No signs of Nausea or vomiting and Pain level controlled  Post-op Vital Signs: Reviewed and stable  Complications: No apparent anesthesia complications

## 2011-07-28 NOTE — Progress Notes (Signed)
Awake. C/O postop abd pain. Will not leave non-rebreather in place. O2 sat 86% on RA. O2 restarted with nasal cannula @ 3l/m. Encouraged to deep breath. O2 sat increased to 95%. Tolerated well.

## 2011-07-28 NOTE — Progress Notes (Signed)
61 yr. Old W. Male for elective repair ol incisional/LIH.  Pt has had previous orthopedic surgery to pubis.  I have reviewed this with his surgeon, and he is not planning any intervention.  Ct scans do not show any great difference with the broken hardware in his pubis.  So will proceed with hernia repair.  Labs reviewed.  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  He has had no febrile episodes.  I have discussed this with anesthesiologist and he feels this might also be due to COPD changes.   Procedure again discussed with Pt. And family and all questions answered.  Surgical site marked.  No clinical change since H&P.  Dict. # [id number].  Filed Vitals:   07/28/11 0715  BP: 122/82  Temp:   Resp: 20  Temp. 97.9 O2 sat. 94% on room air.

## 2011-07-28 NOTE — Progress Notes (Signed)
Post OP Check  Awake and alert wound clean and dry; pt has voided and feels good.  Pain under control.  Filed Vitals:   07/28/11 1755  BP: 115/70  Pulse: 85  Temp: 98.3 F (36.8 C)  Resp: 20   Doing well.  Discharge planned in AM.

## 2011-07-28 NOTE — Progress Notes (Signed)
From OR. [place]. No scrotal edema. Ice pack to LLQ applied.

## 2011-07-28 NOTE — Transfer of Care (Signed)
Immediate Anesthesia Transfer of Care Note  Patient: Adrian Lyons  Procedure(s) Performed: Procedure(s) (LRB): HERNIA REPAIR INGUINAL ADULT (Left)  Patient Location: PACU  Anesthesia Type: General  Level of Consciousness: awake and patient cooperative  Airway & Oxygen Therapy: Patient Spontanous Breathing and Patient connected to face mask oxygen  Post-op Assessment: Report given to PACU RN, Post -op Vital signs reviewed and stable and Patient moving all extremities X 4  Post vital signs: Reviewed and stable  Complications: No apparent anesthesia complications

## 2011-07-28 NOTE — Progress Notes (Signed)
Resting quietly. No scrotal edema. o2 cvontinued.

## 2011-07-29 MED ORDER — TRAMADOL HCL 50 MG PO TABS: 50.0000 mg | ORAL_TABLET | Freq: Four times a day (QID) | ORAL | Status: AC | PRN

## 2011-07-29 MED ORDER — KETOROLAC TROMETHAMINE 30 MG/ML IJ SOLN
30.0000 mg | Freq: Once | INTRAMUSCULAR | Status: AC
  Administered 2011-07-29: 30 mg via INTRAVENOUS
  Filled 2011-07-29: qty 1

## 2011-07-29 MED ORDER — DSS 100 MG PO CAPS: 100.0000 mg | ORAL_CAPSULE | Freq: Two times a day (BID) | ORAL | Status: AC
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
## 2011-07-29 NOTE — Progress Notes (Signed)
POD #1   Wound clean and redressed.  Has some min. incisional pain but doing very well post op.  Filed Vitals:   07/29/11 0540  BP: 111/69  Pulse: 77  Temp: 98.2 F (36.8 C)  Resp: 16   Discharge and follow up has been arranged.  D/C note dict.# [id number]

## 2011-07-29 NOTE — Progress Notes (Signed)
Discharge instructions given with understanding stated, prescriptions given.
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
## 2011-07-29 NOTE — Anesthesia Postprocedure Evaluation (Signed)
  Anesthesia Post-op Note  Patient: Adrian Lyons  Procedure(s) Performed: Procedure(s) (LRB): HERNIA REPAIR INGUINAL ADULT (Left)  Patient Location: Room 302  Anesthesia Type: General  Level of Consciousness: awake, alert , oriented and patient cooperative  Airway and Oxygen Therapy: Patient Spontanous Breathing  Post-op Pain: 5 /10, moderate  Post-op Assessment: Post-op Vital signs reviewed, Patient's Cardiovascular Status Stable, Respiratory Function Stable, Patent Airway and Pain level controlled  Post-op Vital Signs: Reviewed and stable  Complications: No apparent anesthesia complications

## 2011-07-29 NOTE — Addendum Note (Signed)
Addendum  created 07/29/11 0815 by Despina Hidden, CRNA   Modules edited:Notes Section

## 2011-07-31 ENCOUNTER — Encounter (HOSPITAL_COMMUNITY): Payer: Self-pay | Admitting: General Surgery

## 2015-02-26 ENCOUNTER — Emergency Department (HOSPITAL_COMMUNITY)
Admission: EM | Admit: 2015-02-26 | Discharge: 2015-02-26 | Disposition: A | Discharge status: Home / Self Care | Payer: 59 | Attending: Emergency Medicine | Admitting: Emergency Medicine

## 2015-02-26 ENCOUNTER — Emergency Department (HOSPITAL_COMMUNITY): Payer: 59

## 2015-02-26 ENCOUNTER — Inpatient Hospital Stay (HOSPITAL_COMMUNITY): Admit: 2015-02-26 | Payer: Self-pay

## 2015-02-26 ENCOUNTER — Encounter (HOSPITAL_COMMUNITY): Payer: Self-pay | Admitting: *Deleted

## 2015-02-26 DIAGNOSIS — G8929 Other chronic pain: Secondary | ICD-10-CM | POA: Insufficient documentation

## 2015-02-26 DIAGNOSIS — Z880 Allergy status to penicillin: Secondary | ICD-10-CM | POA: Diagnosis not present

## 2015-02-26 DIAGNOSIS — N200 Calculus of kidney: Secondary | ICD-10-CM | POA: Insufficient documentation

## 2015-02-26 DIAGNOSIS — Z8639 Personal history of other endocrine, nutritional and metabolic disease: Secondary | ICD-10-CM | POA: Diagnosis not present

## 2015-02-26 DIAGNOSIS — Z8719 Personal history of other diseases of the digestive system: Secondary | ICD-10-CM | POA: Insufficient documentation

## 2015-02-26 DIAGNOSIS — Z720 Tobacco use: Secondary | ICD-10-CM | POA: Diagnosis not present

## 2015-02-26 DIAGNOSIS — G8321 Monoplegia of upper limb affecting right dominant side: Secondary | ICD-10-CM | POA: Diagnosis not present

## 2015-02-26 DIAGNOSIS — R112 Nausea with vomiting, unspecified: Secondary | ICD-10-CM

## 2015-02-26 DIAGNOSIS — J449 Chronic obstructive pulmonary disease, unspecified: Secondary | ICD-10-CM | POA: Insufficient documentation

## 2015-02-26 DIAGNOSIS — R109 Unspecified abdominal pain: Secondary | ICD-10-CM

## 2015-02-26 DIAGNOSIS — Z872 Personal history of diseases of the skin and subcutaneous tissue: Secondary | ICD-10-CM | POA: Diagnosis not present

## 2015-02-26 LAB — URINE MICROSCOPIC-ADD ON

## 2015-02-26 LAB — CBC WITH DIFFERENTIAL/PLATELET
Basophils Absolute: 0 10*3/uL (ref 0.0–0.1)
Basophils Relative: 0 %
EOS PCT: 1 %
Eosinophils Absolute: 0.1 10*3/uL (ref 0.0–0.7)
HCT: 42.5 % (ref 39.0–52.0)
Hemoglobin: 14.9 g/dL (ref 13.0–17.0)
LYMPHS ABS: 1.6 10*3/uL (ref 0.7–4.0)
LYMPHS PCT: 8 %
MCH: 31.1 pg (ref 26.0–34.0)
MCHC: 35.1 g/dL (ref 30.0–36.0)
MCV: 88.7 fL (ref 78.0–100.0)
MONO ABS: 1.1 10*3/uL — AB (ref 0.1–1.0)
MONOS PCT: 6 %
Neutro Abs: 17.4 10*3/uL — ABNORMAL HIGH (ref 1.7–7.7)
Neutrophils Relative %: 86 %
PLATELETS: 228 10*3/uL (ref 150–400)
RBC: 4.79 MIL/uL (ref 4.22–5.81)
RDW: 13.3 % (ref 11.5–15.5)
WBC: 20.3 10*3/uL — ABNORMAL HIGH (ref 4.0–10.5)

## 2015-02-26 LAB — URINALYSIS, ROUTINE W REFLEX MICROSCOPIC
BILIRUBIN URINE: NEGATIVE
Glucose, UA: NEGATIVE mg/dL
KETONES UR: NEGATIVE mg/dL
Leukocytes, UA: NEGATIVE
Nitrite: NEGATIVE
PROTEIN: NEGATIVE mg/dL
Specific Gravity, Urine: 1.02 (ref 1.005–1.030)
UROBILINOGEN UA: 0.2 mg/dL (ref 0.0–1.0)
pH: 6 (ref 5.0–8.0)

## 2015-02-26 LAB — COMPREHENSIVE METABOLIC PANEL
ALT: 23 U/L (ref 17–63)
AST: 21 U/L (ref 15–41)
Albumin: 3.8 g/dL (ref 3.5–5.0)
Alkaline Phosphatase: 80 U/L (ref 38–126)
Anion gap: 7 (ref 5–15)
BUN: 18 mg/dL (ref 6–20)
CALCIUM: 8.6 mg/dL — AB (ref 8.9–10.3)
CHLORIDE: 110 mmol/L (ref 101–111)
CO2: 22 mmol/L (ref 22–32)
CREATININE: 0.92 mg/dL (ref 0.61–1.24)
Glucose, Bld: 122 mg/dL — ABNORMAL HIGH (ref 65–99)
Potassium: 3.4 mmol/L — ABNORMAL LOW (ref 3.5–5.1)
Sodium: 139 mmol/L (ref 135–145)
Total Bilirubin: 1.1 mg/dL (ref 0.3–1.2)
Total Protein: 6.5 g/dL (ref 6.5–8.1)

## 2015-02-26 MED ORDER — ONDANSETRON HCL 4 MG/2ML IJ SOLN
4.0000 mg | Freq: Once | INTRAMUSCULAR | Status: AC
  Administered 2015-02-26: 4 mg via INTRAVENOUS

## 2015-02-26 MED ORDER — MORPHINE SULFATE (PF) 4 MG/ML IV SOLN
4.0000 mg | Freq: Once | INTRAVENOUS | Status: AC
  Administered 2015-02-26: 4 mg via INTRAVENOUS

## 2015-02-26 MED ORDER — MORPHINE SULFATE (PF) 4 MG/ML IV SOLN
INTRAVENOUS | Status: AC
  Filled 2015-02-26: qty 1

## 2015-02-26 MED ORDER — SODIUM CHLORIDE 0.9 % IV BOLUS (SEPSIS)
1000.0000 mL | Freq: Once | INTRAVENOUS | Status: AC
  Administered 2015-02-26: 1000 mL via INTRAVENOUS

## 2015-02-26 MED ORDER — KETOROLAC TROMETHAMINE 30 MG/ML IJ SOLN
30.0000 mg | Freq: Once | INTRAMUSCULAR | Status: AC
  Administered 2015-02-26: 30 mg via INTRAVENOUS

## 2015-02-26 MED ORDER — KETOROLAC TROMETHAMINE 30 MG/ML IJ SOLN
INTRAMUSCULAR | Status: AC
  Filled 2015-02-26: qty 1

## 2015-02-26 MED ORDER — HYDROCODONE-ACETAMINOPHEN 5-325 MG PO TABS: 2.0000 | ORAL_TABLET | ORAL | Status: DC | PRN

## 2015-02-26 MED ORDER — ONDANSETRON HCL 4 MG/2ML IJ SOLN
INTRAMUSCULAR | Status: AC
  Filled 2015-02-26: qty 2
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
## 2015-02-26 NOTE — ED Notes (Signed)
Pt c/o severe right groin pain and lower back pain that woke him up from sleep

## 2015-02-26 NOTE — Discharge Instructions (Signed)
If you were given medicines take as directed.  If you are on coumadin or contraceptives realize their levels and effectiveness is altered by many different medicines.  If you have any reaction (rash, tongues swelling, other) to the medicines stop taking and see a physician.   Take ibuprofen OR naproxen every 6 hrs for pain, For severe pain take norco or vicodin however realize they have the potential for addiction and it can make you sleepy and has tylenol in it.  No operating machinery while taking.  If your blood pressure was elevated in the ER make sure you follow up for management with a primary doctor or return for chest pain, shortness of breath or stroke symptoms.  Please follow up as directed and return to the ER or see a physician for new or worsening symptoms.  Thank you. Filed Vitals:   02/26/15 0615 02/26/15 0700 02/26/15 0815 02/26/15 0816  BP: 155/106 123/82 113/78   Pulse: 80 81  75  Temp:      TempSrc:      Resp:      Height:      Weight:      SpO2: 98% 95%  93%

## 2015-02-26 NOTE — ED Provider Notes (Signed)
Patient's care signed out to follow-up CT scan results. Discuss with radiologist small kidney stone right UVJ. Pain controlled in ER no fever. Outpatient follow-up discussed.  Right flank pain  Nausea and vomiting, vomiting of unspecified type  Kidney stone on right side    Blane OharaJoshua Kaity Pitstick, MD 02/26/15 [phone]

## 2016-05-02 ENCOUNTER — Emergency Department (HOSPITAL_COMMUNITY)
Admission: EM | Admit: 2016-05-02 | Discharge: 2016-05-03 | Disposition: A | Discharge status: Home / Self Care | Payer: 59 | Attending: Emergency Medicine | Admitting: Emergency Medicine

## 2016-05-02 ENCOUNTER — Encounter (HOSPITAL_COMMUNITY): Payer: Self-pay | Admitting: Emergency Medicine

## 2016-05-02 ENCOUNTER — Emergency Department (HOSPITAL_COMMUNITY): Payer: 59

## 2016-05-02 DIAGNOSIS — R05 Cough: Secondary | ICD-10-CM | POA: Diagnosis present

## 2016-05-02 DIAGNOSIS — R52 Pain, unspecified: Secondary | ICD-10-CM | POA: Diagnosis not present

## 2016-05-02 DIAGNOSIS — Z791 Long term (current) use of non-steroidal anti-inflammatories (NSAID): Secondary | ICD-10-CM | POA: Insufficient documentation

## 2016-05-02 DIAGNOSIS — R067 Sneezing: Secondary | ICD-10-CM | POA: Insufficient documentation

## 2016-05-02 DIAGNOSIS — F17210 Nicotine dependence, cigarettes, uncomplicated: Secondary | ICD-10-CM | POA: Diagnosis not present

## 2016-05-02 DIAGNOSIS — R509 Fever, unspecified: Secondary | ICD-10-CM | POA: Insufficient documentation

## 2016-05-02 DIAGNOSIS — R6889 Other general symptoms and signs: Secondary | ICD-10-CM

## 2016-05-02 DIAGNOSIS — J449 Chronic obstructive pulmonary disease, unspecified: Secondary | ICD-10-CM | POA: Diagnosis not present

## 2016-05-02 MED ORDER — LOPERAMIDE HCL 2 MG PO CAPS
2.0000 mg | ORAL_CAPSULE | Freq: Once | ORAL | Status: DC
  Filled 2016-05-02: qty 1

## 2016-05-02 MED ORDER — IBUPROFEN 800 MG PO TABS
800.0000 mg | ORAL_TABLET | Freq: Once | ORAL | Status: AC
  Administered 2016-05-02: 800 mg via ORAL
  Filled 2016-05-02: qty 1

## 2016-05-02 MED ORDER — ONDANSETRON 4 MG PO TBDP: 4.0000 mg | ORAL_TABLET | Freq: Three times a day (TID) | ORAL | 0 refills | Status: AC | PRN

## 2016-05-02 MED ORDER — BENZONATATE 100 MG PO CAPS: 100.0000 mg | ORAL_CAPSULE | Freq: Three times a day (TID) | ORAL | 0 refills | Status: AC | PRN

## 2016-05-02 MED ORDER — GUAIFENESIN 100 MG/5ML PO SOLN
5.0000 mL | Freq: Once | ORAL | Status: AC
  Administered 2016-05-02: 100 mg via ORAL
  Filled 2016-05-02: qty 5

## 2016-05-02 MED ORDER — IPRATROPIUM-ALBUTEROL 0.5-2.5 (3) MG/3ML IN SOLN
3.0000 mL | Freq: Once | RESPIRATORY_TRACT | Status: AC
  Administered 2016-05-02: 3 mL via RESPIRATORY_TRACT
  Filled 2016-05-02: qty 3

## 2016-05-02 MED ORDER — ALBUTEROL SULFATE HFA 108 (90 BASE) MCG/ACT IN AERS
2.0000 | INHALATION_SPRAY | Freq: Once | RESPIRATORY_TRACT | Status: AC
  Administered 2016-05-02: 2 via RESPIRATORY_TRACT
  Filled 2016-05-02: qty 6.7

## 2016-05-02 MED ORDER — ONDANSETRON 4 MG PO TBDP
4.0000 mg | ORAL_TABLET | Freq: Once | ORAL | Status: AC
  Administered 2016-05-02: 4 mg via ORAL
  Filled 2016-05-02: qty 1

## 2016-05-02 NOTE — ED Triage Notes (Signed)
Pt states he has been coughing, sneezing, and mild fever with body aches since Tuesday
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
## 2016-05-02 NOTE — ED Notes (Signed)
Patient transported to X-ray 

## 2016-05-02 NOTE — ED Notes (Signed)
Pt returned from xray

## 2016-05-02 NOTE — Discharge Instructions (Signed)
Please alternate between Tylenol 1000 mg and ibuprofen 800 mg as needed for fever, pain. You may take over-the-counter Mucinex (guaifenesin) to help with your cough. May take over-the-counter Imodium to help with your diarrhea.  He may use her albuterol inhaler to 4 puffs every 2-4 hours as needed for wheezing and shortness of breath. You do not have pneumonia today. You do not need antibiotics at this time. If your symptoms worsen, please follow up with your primary care physician.

## 2016-05-20 ENCOUNTER — Emergency Department (HOSPITAL_COMMUNITY): Payer: 59

## 2016-05-20 ENCOUNTER — Encounter (HOSPITAL_COMMUNITY): Payer: Self-pay | Admitting: Emergency Medicine

## 2016-05-20 ENCOUNTER — Emergency Department (HOSPITAL_COMMUNITY)
Admission: EM | Admit: 2016-05-20 | Discharge: 2016-05-20 | Disposition: A | Discharge status: Home / Self Care | Payer: 59 | Attending: Emergency Medicine | Admitting: Emergency Medicine

## 2016-05-20 DIAGNOSIS — J449 Chronic obstructive pulmonary disease, unspecified: Secondary | ICD-10-CM | POA: Diagnosis not present

## 2016-05-20 DIAGNOSIS — R531 Weakness: Secondary | ICD-10-CM | POA: Diagnosis present

## 2016-05-20 DIAGNOSIS — R110 Nausea: Secondary | ICD-10-CM | POA: Diagnosis not present

## 2016-05-20 DIAGNOSIS — Z791 Long term (current) use of non-steroidal anti-inflammatories (NSAID): Secondary | ICD-10-CM | POA: Insufficient documentation

## 2016-05-20 DIAGNOSIS — Z79899 Other long term (current) drug therapy: Secondary | ICD-10-CM | POA: Diagnosis not present

## 2016-05-20 DIAGNOSIS — F17210 Nicotine dependence, cigarettes, uncomplicated: Secondary | ICD-10-CM | POA: Diagnosis not present

## 2016-05-20 LAB — URINALYSIS, ROUTINE W REFLEX MICROSCOPIC
Bilirubin Urine: NEGATIVE
Glucose, UA: NEGATIVE mg/dL
Hgb urine dipstick: NEGATIVE
KETONES UR: NEGATIVE mg/dL
LEUKOCYTES UA: NEGATIVE
NITRITE: NEGATIVE
PH: 5 (ref 5.0–8.0)
Protein, ur: NEGATIVE mg/dL
SPECIFIC GRAVITY, URINE: 1.03 (ref 1.005–1.030)

## 2016-05-20 LAB — COMPREHENSIVE METABOLIC PANEL
ALT: 39 U/L (ref 17–63)
ANION GAP: 7 (ref 5–15)
AST: 29 U/L (ref 15–41)
Albumin: 4.2 g/dL (ref 3.5–5.0)
Alkaline Phosphatase: 97 U/L (ref 38–126)
BILIRUBIN TOTAL: 0.5 mg/dL (ref 0.3–1.2)
BUN: 11 mg/dL (ref 6–20)
CO2: 26 mmol/L (ref 22–32)
Calcium: 9 mg/dL (ref 8.9–10.3)
Chloride: 102 mmol/L (ref 101–111)
Creatinine, Ser: 1.03 mg/dL (ref 0.61–1.24)
Glucose, Bld: 103 mg/dL — ABNORMAL HIGH (ref 65–99)
POTASSIUM: 4 mmol/L (ref 3.5–5.1)
Sodium: 135 mmol/L (ref 135–145)
TOTAL PROTEIN: 7.5 g/dL (ref 6.5–8.1)

## 2016-05-20 LAB — CBC
HEMATOCRIT: 45 % (ref 39.0–52.0)
HEMOGLOBIN: 15.4 g/dL (ref 13.0–17.0)
MCH: 30.3 pg (ref 26.0–34.0)
MCHC: 34.2 g/dL (ref 30.0–36.0)
MCV: 88.4 fL (ref 78.0–100.0)
Platelets: 229 10*3/uL (ref 150–400)
RBC: 5.09 MIL/uL (ref 4.22–5.81)
RDW: 13.5 % (ref 11.5–15.5)
WBC: 9.5 10*3/uL (ref 4.0–10.5)

## 2016-05-20 LAB — LIPASE, BLOOD: Lipase: 19 U/L (ref 11–51)

## 2016-05-20 MED ORDER — ALBUTEROL SULFATE (2.5 MG/3ML) 0.083% IN NEBU
5.0000 mg | INHALATION_SOLUTION | Freq: Once | RESPIRATORY_TRACT | Status: AC
  Administered 2016-05-20: 5 mg via RESPIRATORY_TRACT
  Filled 2016-05-20: qty 6

## 2016-05-20 MED ORDER — ACETAMINOPHEN 325 MG PO TABS
650.0000 mg | ORAL_TABLET | Freq: Once | ORAL | Status: AC | PRN
  Administered 2016-05-20: 650 mg via ORAL
  Filled 2016-05-20: qty 2

## 2016-05-20 MED ORDER — SODIUM CHLORIDE 0.9 % IV BOLUS (SEPSIS)
1000.0000 mL | Freq: Once | INTRAVENOUS | Status: AC
  Administered 2016-05-20: 1000 mL via INTRAVENOUS

## 2016-05-20 MED ORDER — PREDNISONE 20 MG PO TABS: 40.0000 mg | ORAL_TABLET | Freq: Every day | ORAL | 0 refills | Status: AC

## 2016-05-20 MED ORDER — METHYLPREDNISOLONE SODIUM SUCC 125 MG IJ SOLR: 125.0000 mg | Freq: Once | INTRAMUSCULAR | Status: DC

## 2016-05-20 MED ORDER — KETOROLAC TROMETHAMINE 30 MG/ML IJ SOLN
15.0000 mg | Freq: Once | INTRAMUSCULAR | Status: AC
  Administered 2016-05-20: 15 mg via INTRAVENOUS
  Filled 2016-05-20: qty 1

## 2016-05-20 MED ORDER — DEXAMETHASONE SODIUM PHOSPHATE 10 MG/ML IJ SOLN
10.0000 mg | Freq: Once | INTRAMUSCULAR | Status: AC
  Administered 2016-05-20: 10 mg via INTRAVENOUS
  Filled 2016-05-20: qty 1

## 2016-05-20 NOTE — Discharge Instructions (Signed)
As discussed, tonight's evaluation has been largely reassuring, and is currently no evidence for pneumonia, and acute bacterial infection.  You're likely expressing a prolonged recovery from your influenza-like illness, with worsening of your COPD.  However, with your ongoing symptoms, please take all medication as directed and be sure to follow-up with her primary care physician within the week for repeat evaluation.  For the next 2 days please use your albuterol every 4 hours in addition to the prescribed steroids.   Return here for concerning changes in your condition.

## 2016-05-20 NOTE — ED Triage Notes (Addendum)
Pt reports fever and bodyaches with n/v/d for 3 weeks. Denies abd pain. Pt also has productive cough, white sputum, and pain in ribs when coughing.  Pt was seen the first week of the new year in ED and given albuterol tx and sent home. Pt has not taken any antipyretics today.  Pts girlfriends daughter has tested positive for strep throat and pt is around her often.  Pt alert and oriented.
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
## 2016-11-19 ENCOUNTER — Emergency Department (HOSPITAL_COMMUNITY)
Admission: EM | Admit: 2016-11-19 | Discharge: 2016-11-19 | Disposition: A | Discharge status: Home / Self Care | Payer: 59 | Attending: Emergency Medicine | Admitting: Emergency Medicine

## 2016-11-19 ENCOUNTER — Encounter (HOSPITAL_COMMUNITY): Payer: Self-pay | Admitting: Emergency Medicine

## 2016-11-19 ENCOUNTER — Emergency Department (HOSPITAL_COMMUNITY): Payer: 59

## 2016-11-19 DIAGNOSIS — Z791 Long term (current) use of non-steroidal anti-inflammatories (NSAID): Secondary | ICD-10-CM | POA: Diagnosis not present

## 2016-11-19 DIAGNOSIS — S0990XA Unspecified injury of head, initial encounter: Secondary | ICD-10-CM

## 2016-11-19 DIAGNOSIS — J449 Chronic obstructive pulmonary disease, unspecified: Secondary | ICD-10-CM | POA: Insufficient documentation

## 2016-11-19 DIAGNOSIS — Z79899 Other long term (current) drug therapy: Secondary | ICD-10-CM | POA: Insufficient documentation

## 2016-11-19 DIAGNOSIS — S0101XA Laceration without foreign body of scalp, initial encounter: Secondary | ICD-10-CM | POA: Diagnosis not present

## 2016-11-19 DIAGNOSIS — G8929 Other chronic pain: Secondary | ICD-10-CM | POA: Insufficient documentation

## 2016-11-19 DIAGNOSIS — W228XXA Striking against or struck by other objects, initial encounter: Secondary | ICD-10-CM | POA: Insufficient documentation

## 2016-11-19 DIAGNOSIS — F17210 Nicotine dependence, cigarettes, uncomplicated: Secondary | ICD-10-CM | POA: Diagnosis not present

## 2016-11-19 DIAGNOSIS — Y9389 Activity, other specified: Secondary | ICD-10-CM | POA: Diagnosis not present

## 2016-11-19 DIAGNOSIS — Y929 Unspecified place or not applicable: Secondary | ICD-10-CM | POA: Diagnosis not present

## 2016-11-19 DIAGNOSIS — Y999 Unspecified external cause status: Secondary | ICD-10-CM | POA: Diagnosis not present

## 2016-11-19 MED ORDER — PENTAFLUOROPROP-TETRAFLUOROETH EX AERO
INHALATION_SPRAY | Freq: Once | CUTANEOUS | Status: DC
  Filled 2016-11-19: qty 103.5

## 2016-11-19 MED ORDER — TETANUS-DIPHTH-ACELL PERTUSSIS 5-2.5-18.5 LF-MCG/0.5 IM SUSP
0.5000 mL | Freq: Once | INTRAMUSCULAR | Status: AC
  Administered 2016-11-19: 0.5 mL via INTRAMUSCULAR
  Filled 2016-11-19: qty 0.5
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
## 2016-11-19 NOTE — Discharge Instructions (Signed)
Alternate 600 mg of ibuprofen and [phone] mg of Tylenol every 3 hours as needed for pain. Do not exceed 4000 mg of Tylenol daily. Apply ice or heat to the effected area for comfort. You may shower tomorrow, but do not rub the area. You may patent the area dry. Apply antibiotic ointment to the area twice daily. Follow up in the ED or with your primary care physician in 10 days for staple removal and wound check. Return to the ED immediately if any concerning signs or symptoms of infection develop or altered mental status, weakness, confusion, or any other new neurological symptoms.

## 2016-11-19 NOTE — ED Notes (Signed)
Pt to CT at this time.
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
## 2018-10-06 ENCOUNTER — Other Ambulatory Visit (HOSPITAL_BASED_OUTPATIENT_CLINIC_OR_DEPARTMENT_OTHER): Payer: Self-pay

## 2018-10-06 DIAGNOSIS — R0683 Snoring: Secondary | ICD-10-CM

## 2018-10-06 DIAGNOSIS — G4730 Sleep apnea, unspecified: Secondary | ICD-10-CM

## 2018-10-21 ENCOUNTER — Other Ambulatory Visit (HOSPITAL_BASED_OUTPATIENT_CLINIC_OR_DEPARTMENT_OTHER): Payer: Self-pay

## 2018-10-22 ENCOUNTER — Other Ambulatory Visit: Payer: Self-pay

## 2018-10-22 ENCOUNTER — Other Ambulatory Visit (HOSPITAL_COMMUNITY): Payer: 59

## 2018-10-22 ENCOUNTER — Other Ambulatory Visit (HOSPITAL_COMMUNITY)
Admission: RE | Admit: 2018-10-22 | Discharge: 2018-10-22 | Disposition: A | Discharge status: Home / Self Care | Payer: 59 | Source: Ambulatory Visit | Attending: Neurology | Admitting: Neurology

## 2018-10-22 DIAGNOSIS — Z1159 Encounter for screening for other viral diseases: Secondary | ICD-10-CM | POA: Insufficient documentation

## 2018-10-23 LAB — NOVEL CORONAVIRUS, NAA (HOSP ORDER, SEND-OUT TO REF LAB; TAT 18-24 HRS): SARS-CoV-2, NAA: NOT DETECTED

## 2018-10-26 ENCOUNTER — Other Ambulatory Visit: Payer: Self-pay

## 2018-10-26 ENCOUNTER — Ambulatory Visit: Payer: 59 | Attending: Internal Medicine | Admitting: Neurology

## 2018-10-26 DIAGNOSIS — G4730 Sleep apnea, unspecified: Secondary | ICD-10-CM | POA: Diagnosis present

## 2018-10-26 DIAGNOSIS — R0683 Snoring: Secondary | ICD-10-CM | POA: Insufficient documentation

## 2018-10-30 NOTE — Procedures (Signed)
[place] A. Merlene Laughter, MD     [email or web address]             NOCTURNAL POLYSOMNOGRAPHY   LOCATION: [facility]  Patient Name: Adrian Lyons, Adrian Lyons Date: 10/26/2018 Gender: Male D.O.B: 10-10-1970 Age (years): 47 Referring Provider: Delphina Cahill Height (inches): 68 Interpreting Physician: Phillips Odor MD, ABSM Weight (lbs): 185 RPSGT: Rosebud Poles BMI: 28 MRN: [id number] Neck Size: 16.50 CLINICAL INFORMATION Sleep Study Type: NPSG     Indication for sleep study: Snoring     Epworth Sleepiness Score: 12     SLEEP STUDY TECHNIQUE As per the AASM Manual for the Scoring of Sleep and Associated Events v2.3 (April 2016) with a hypopnea requiring 4% desaturations.  The channels recorded and monitored were frontal, central and occipital EEG, electrooculogram (EOG), submentalis EMG (chin), nasal and oral airflow, thoracic and abdominal wall motion, anterior tibialis EMG, snore microphone, electrocardiogram, and pulse oximetry.  MEDICATIONS  Current Outpatient Medications:  .  acetaminophen (TYLENOL) 500 MG tablet, Take 500 mg by mouth every 6 (six) hours as needed for mild pain or moderate pain., Disp: , Rfl:  .  benzonatate (TESSALON) 100 MG capsule, Take 1 capsule (100 mg total) by mouth 3 (three) times daily as needed for cough. (Patient not taking: Reported on 05/20/2016), Disp: 30 capsule, Rfl: 0 .  ibuprofen (ADVIL,MOTRIN) 200 MG tablet, Take 200 mg by mouth every 6 (six) hours as needed for mild pain or moderate pain., Disp: , Rfl:  .  Menthol (HALLS COUGH DROPS MT), Use as directed in the mouth or throat daily as needed (for cough)., Disp: , Rfl:  .  naproxen sodium (ANAPROX) 220 MG tablet, Take 660 mg by mouth daily., Disp: , Rfl:  .  ondansetron (ZOFRAN ODT) 4 MG disintegrating tablet, Take 1 tablet (4 mg total) by mouth every 8 (eight) hours as needed for nausea or vomiting., Disp: 20 tablet, Rfl: 0 .  predniSONE (DELTASONE) 20 MG tablet, Take 2  tablets (40 mg total) by mouth daily with breakfast. For the next four days, Disp: 8 tablet, Rfl: 0 .  Pseudoeph-Doxylamine-DM-APAP (DAYQUIL/NYQUIL COLD/FLU RELIEF PO), Take 10-15 mLs by mouth daily as needed (for cold symptoms)., Disp: , Rfl:       SLEEP ARCHITECTURE The study was initiated at 11:14:11 PM and ended at 2:54:36 AM.  Sleep onset time was 30.8 minutes and the sleep efficiency was 66.0%%. The total sleep time was 145.5 minutes.  Stage REM latency was 117.5 minutes.  The patient spent 14.1%% of the night in stage N1 sleep, 36.1%% in stage N2 sleep, 36.1%% in stage N3 and 13.8% in REM.  Alpha intrusion was absent.  Supine sleep was 0.00%.  RESPIRATORY PARAMETERS The overall apnea/hypopnea index (AHI) was 22.7 per hour. There were 22 total apneas, including 18 obstructive, 3 central and 1 mixed apneas. There were 33 hypopneas and 3 RERAs.  The AHI during Stage REM sleep was 0.0 per hour.  AHI while supine was N/A per hour.  The mean oxygen saturation was 92.8%. The minimum SpO2 during sleep was 88.0%.  loud snoring was noted during this study.  CARDIAC DATA The 2 lead EKG demonstrated sinus rhythm. The mean heart rate was 70.2 beats per minute. Other EKG findings include: None.  LEG MOVEMENT DATA The total PLMS were 0 with a resulting PLMS index of 0.0. Associated arousal with leg movement index was 0.0.  IMPRESSIONS 1. Moderate obstructive sleep apnea is documented with this recording. A trial of AutoPAP 8-14 is  recommended.   Argie RammingKofi A Tyyne Cliett, MD Diplomate, American Board of Sleep Medicine. ELECTRONICALLY SIGNED ON:  10/30/2018, 2:24 PM [facility] PH: [phone]   FX: [phone] ACCREDITED BY THE AMERICAN ACADEMY OF SLEEP MEDICINE

## 2019-01-15 IMAGING — DX DG CHEST 2V
2 series · 2 of 2 positions shown · non-contrast
Comparison: 05/02/2016 and earlier.

CLINICAL DATA: 45-year-old male with fever chills weakness and body
aches since last night. Cold symptoms since [REDACTED]. Initial
encounter. Smoker.

EXAM:
CHEST  2 VIEW

[chest pa]
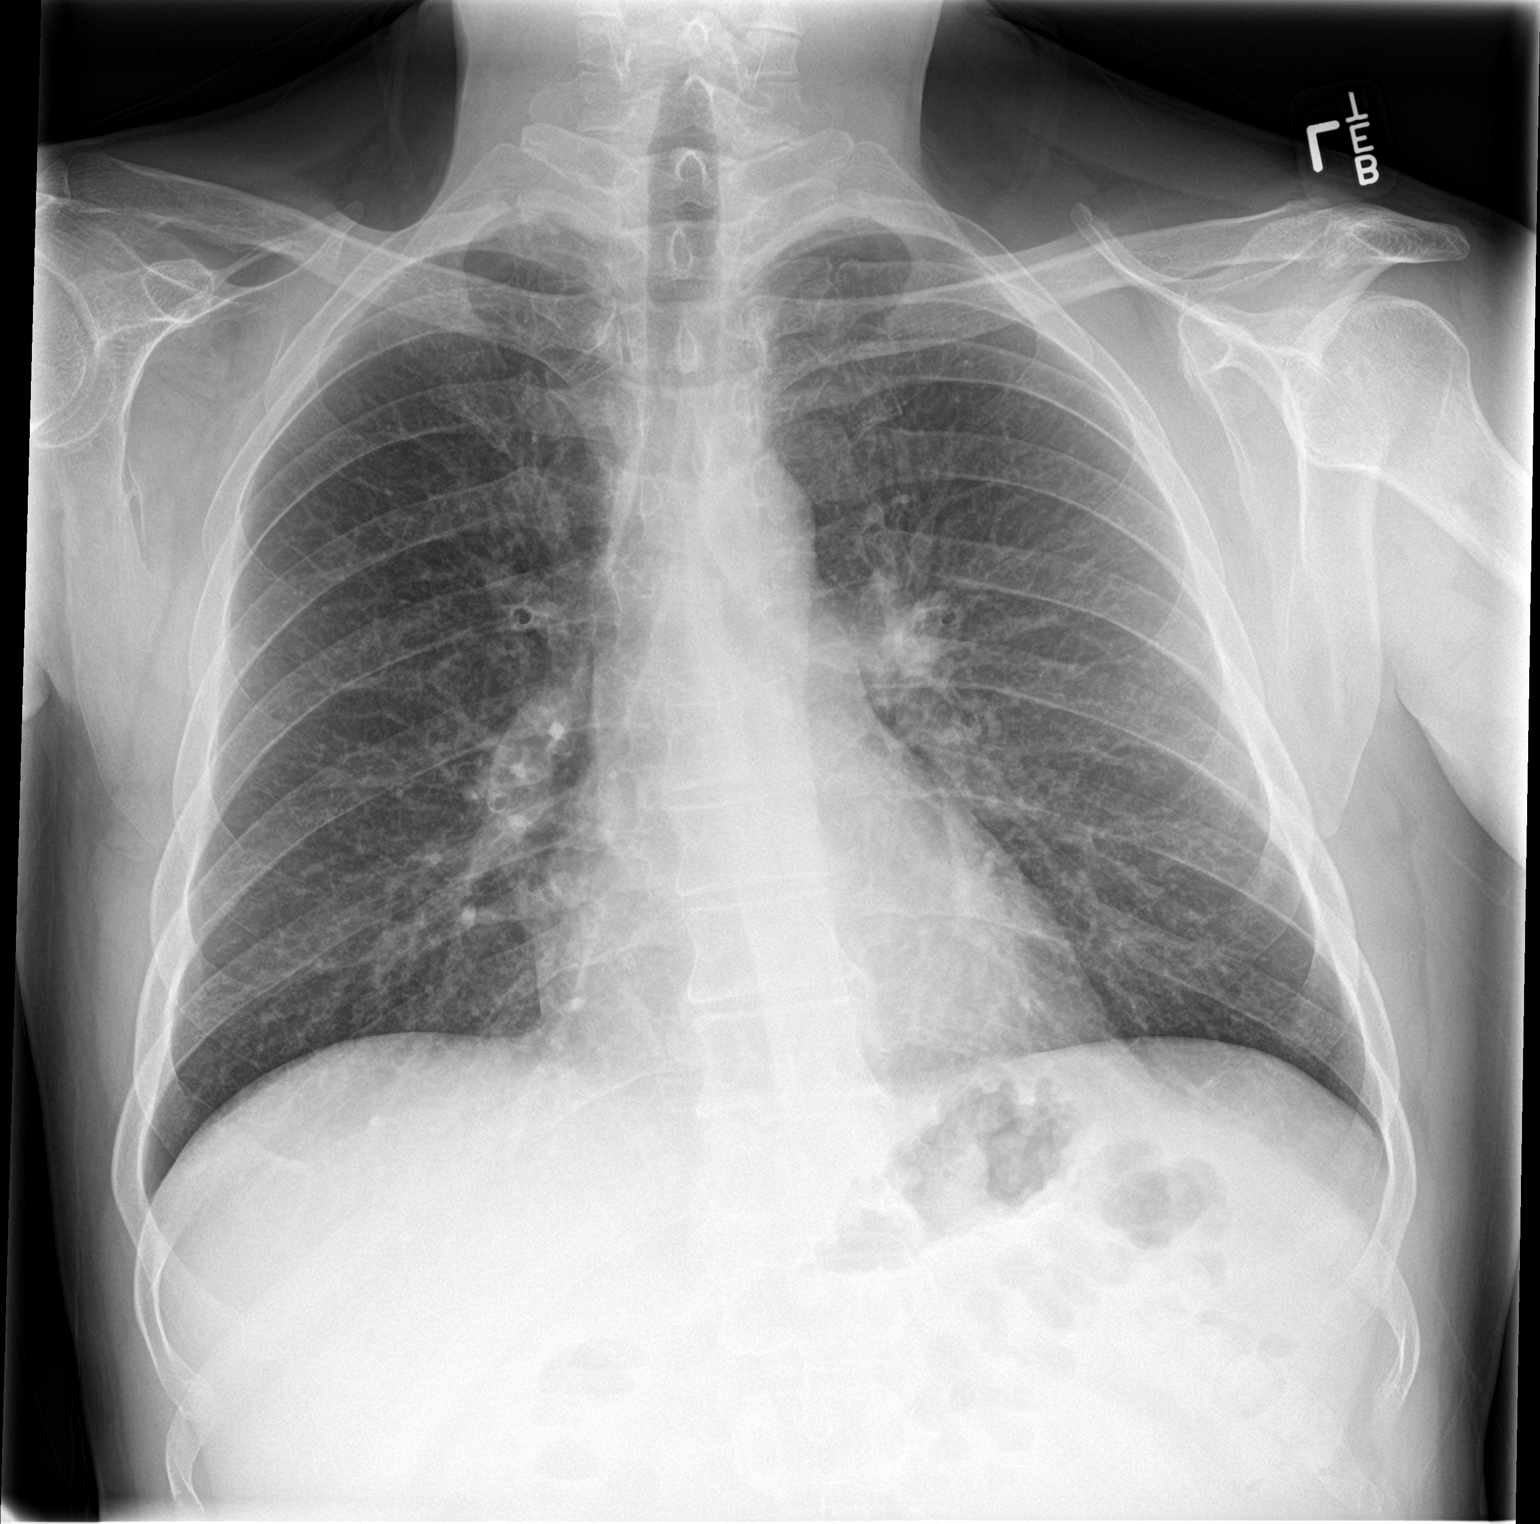

[chest lat]
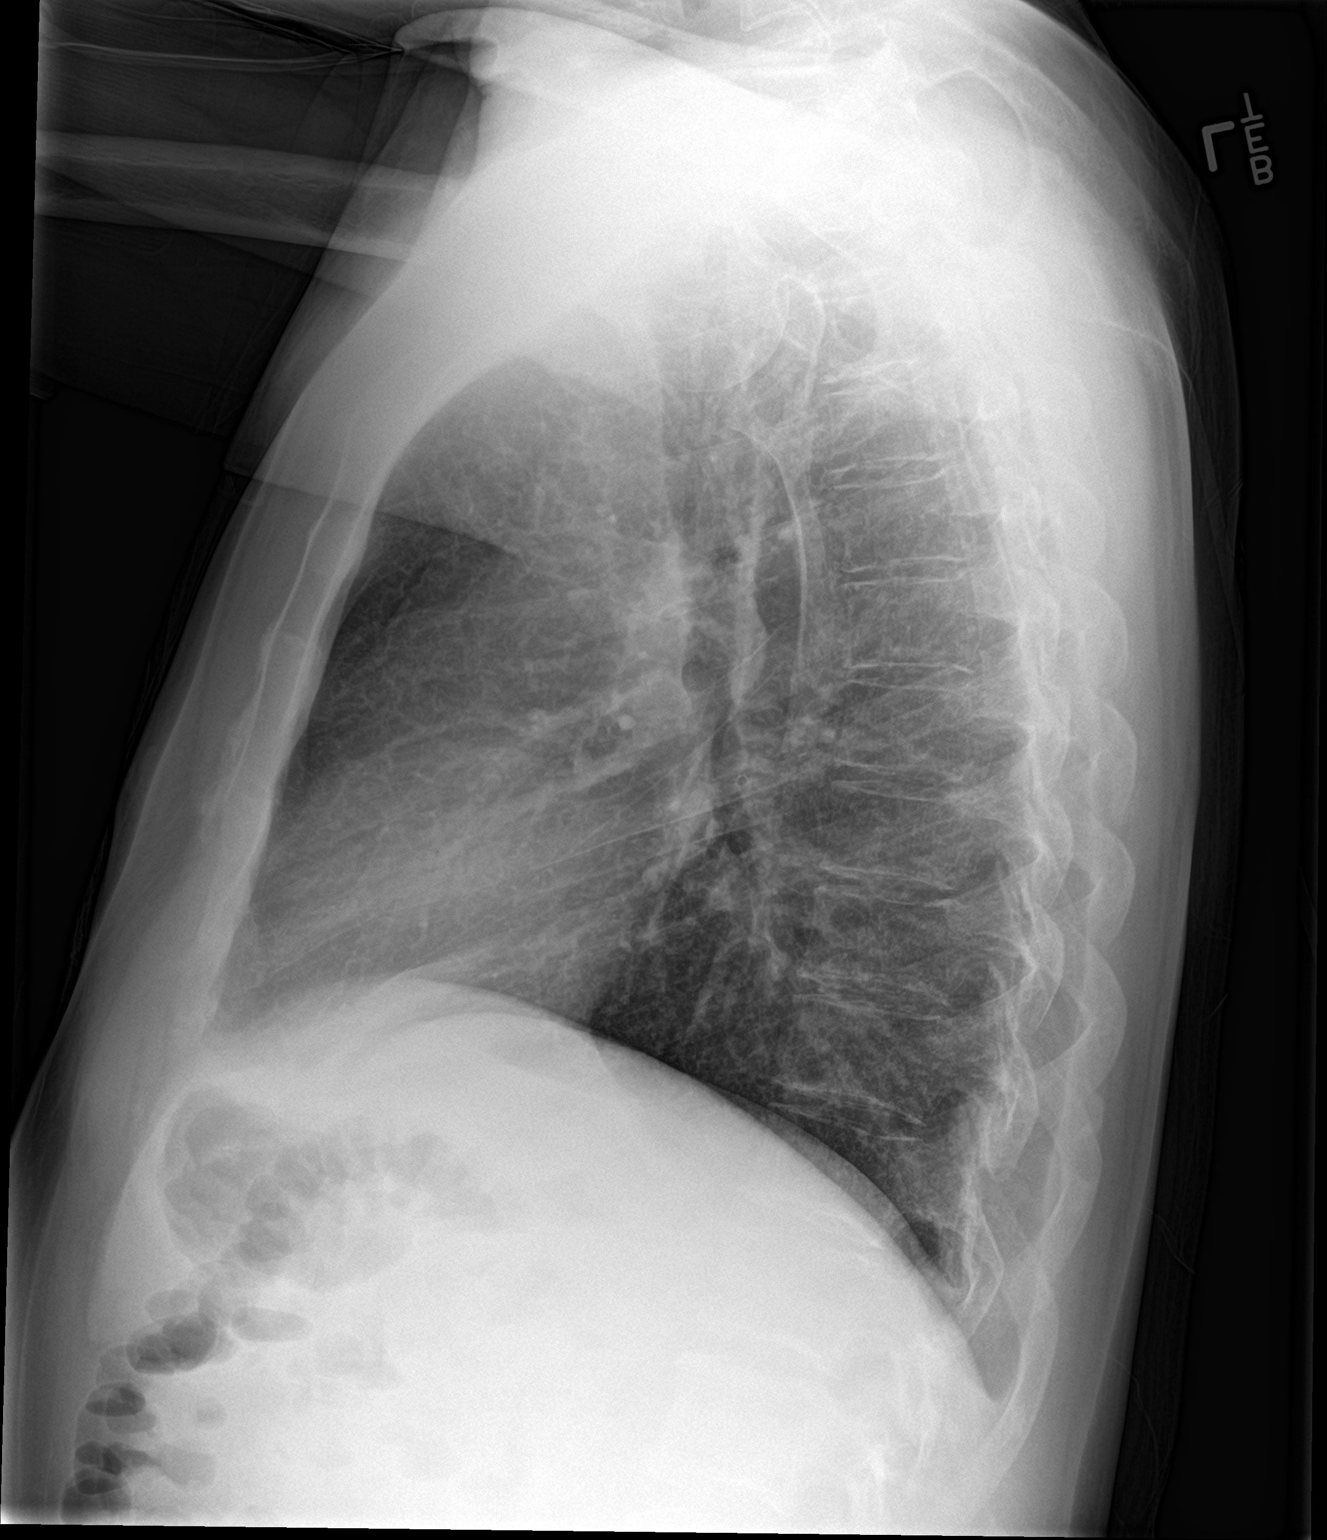

[2 of 2 positions shown; findings below may reference images not displayed]

FINDINGS: Lung volumes remain normal. Normal cardiac size and mediastinal
contours. Visualized tracheal air column is within normal limits.
Unchanged mildly increased interstitial markings throughout. No
pneumothorax, pulmonary edema, pleural effusion or confluent
pulmonary opacity. No acute osseous abnormality identified. Negative
visible bowel gas pattern.
IMPRESSION: No acute cardiopulmonary abnormality.

## 2019-08-18 ENCOUNTER — Other Ambulatory Visit: Payer: Self-pay

## 2019-12-01 ENCOUNTER — Other Ambulatory Visit: Payer: Self-pay | Admitting: Internal Medicine

## 2019-12-01 ENCOUNTER — Other Ambulatory Visit (HOSPITAL_COMMUNITY): Payer: Self-pay | Admitting: Internal Medicine

## 2019-12-01 DIAGNOSIS — H5340 Unspecified visual field defects: Secondary | ICD-10-CM

## 2019-12-23 ENCOUNTER — Ambulatory Visit (HOSPITAL_COMMUNITY)
Admission: RE | Admit: 2019-12-23 | Discharge: 2019-12-23 | Disposition: A | Discharge status: Home / Self Care | Payer: 59 | Source: Ambulatory Visit | Attending: Internal Medicine | Admitting: Internal Medicine

## 2019-12-23 ENCOUNTER — Other Ambulatory Visit: Payer: Self-pay

## 2019-12-23 DIAGNOSIS — H5340 Unspecified visual field defects: Secondary | ICD-10-CM | POA: Diagnosis present

## 2019-12-23 LAB — POCT I-STAT CREATININE: Creatinine, Ser: 0.9 mg/dL (ref 0.61–1.24)

## 2019-12-23 MED ORDER — IOHEXOL 300 MG/ML  SOLN
75.0000 mL | Freq: Once | INTRAMUSCULAR | Status: AC | PRN
  Administered 2019-12-23: 75 mL via INTRAVENOUS

## 2022-08-19 IMAGING — CT CT HEAD WO/W CM
3 of 4 series · 16 of 47 positions shown, 19 images · IV contrast (Omnipaque or Isovue)
Comparison: 11/19/2016

CLINICAL DATA: Lost peripheral vision on the right in Ci.

EXAM:
CT HEAD WITHOUT AND WITH CONTRAST
TECHNIQUE: Contiguous axial images were obtained from the base of the skull
through the vertex without and with intravenous contrast
CONTRAST:  75mL OMNIPAQUE IOHEXOL 300 MG/ML  SOLN

[Series 2: head w o · axial · 0.42mm/px · z∈[+24,+149]mm · 10 of 31 slices shown, 13 images]
[im 3/31  brain]
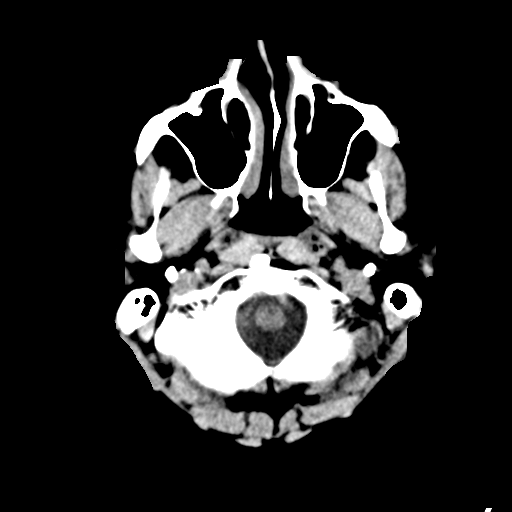
[im 3/31  bone]
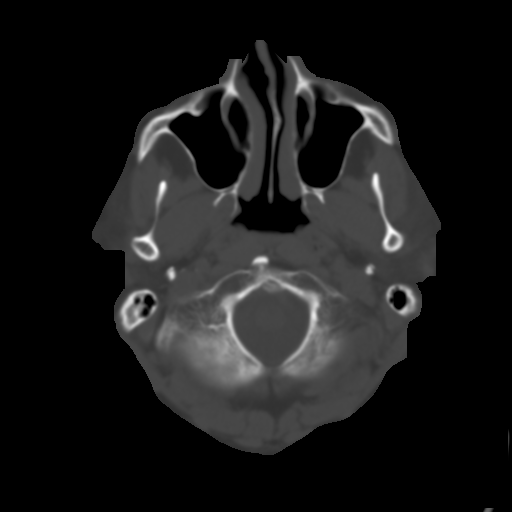
[im 5/31  brain]
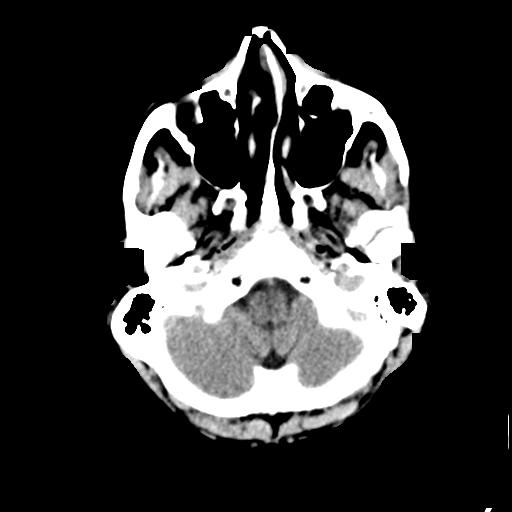
[im 9/31  brain]
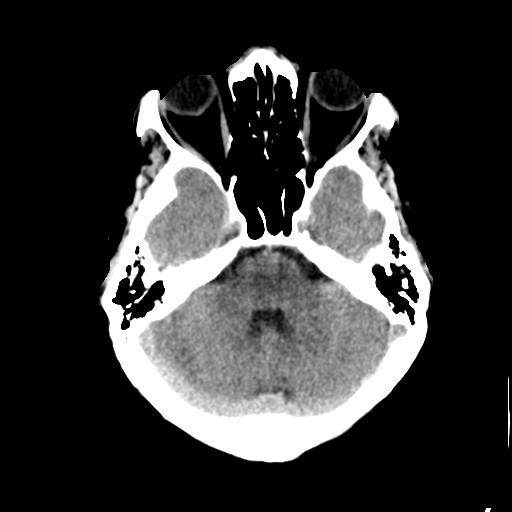
[im 11/31  brain]
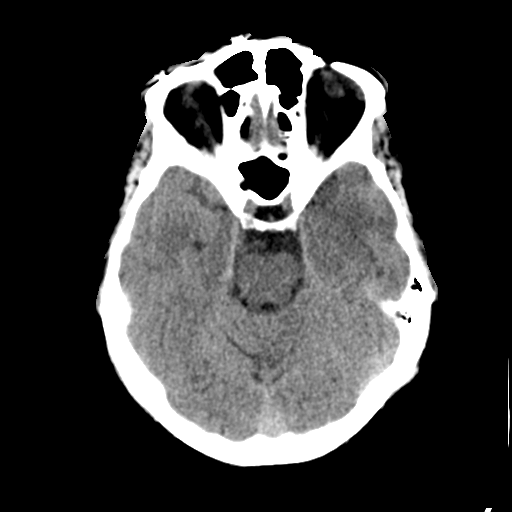
[im 13/31  brain]
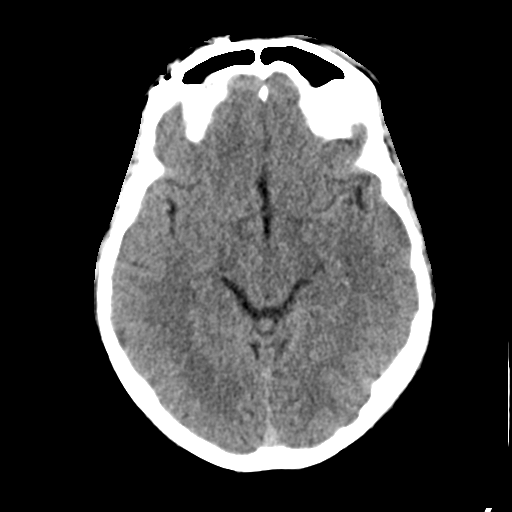
[im 13/31  bone]
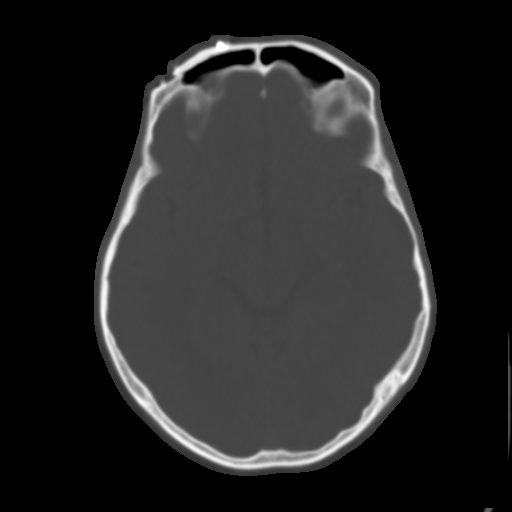
[im 18/31  brain]
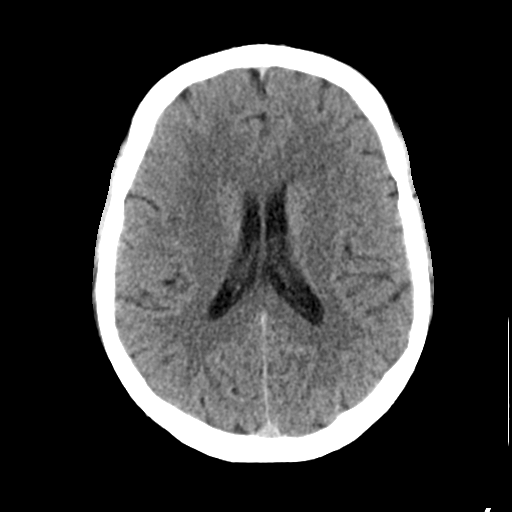
[im 20/31  brain]
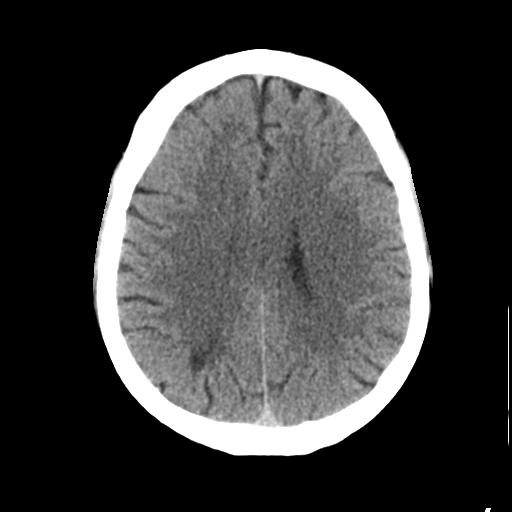
[im 22/31  brain]
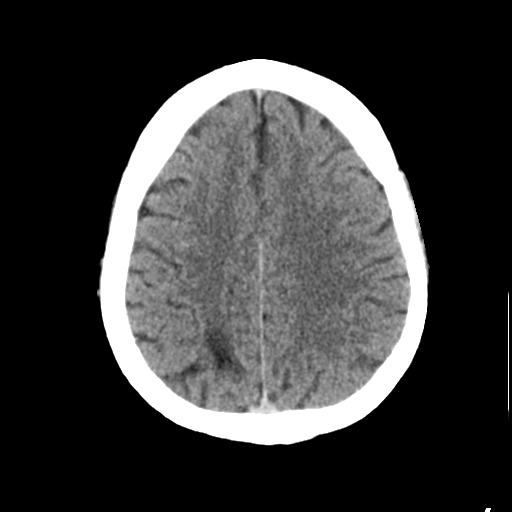
[im 26/31  brain]
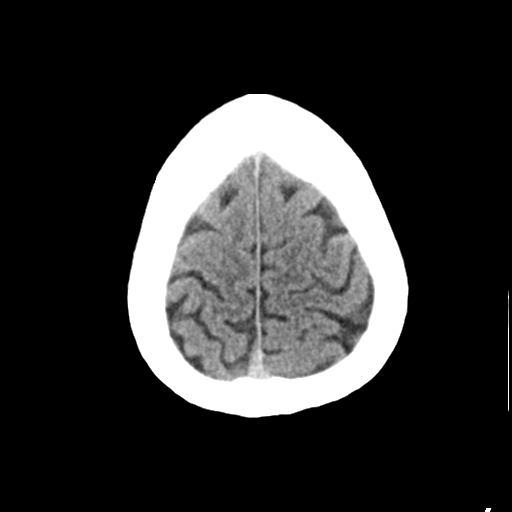
[im 26/31  bone]
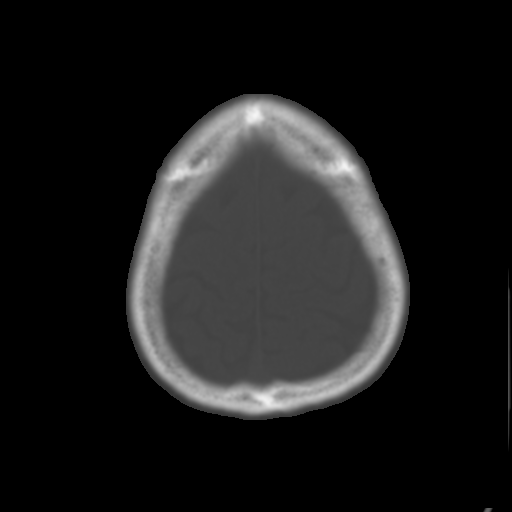
[im 28/31  brain]
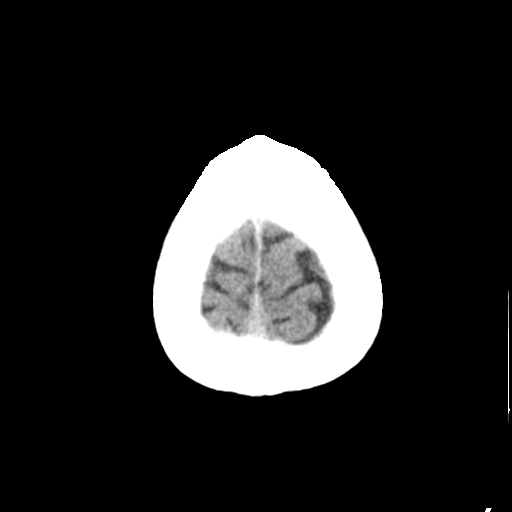

[Series 5: coronal soft · coronal · 0.34mm/px · 3 of 71 slices shown]
[im 24/71  brain]
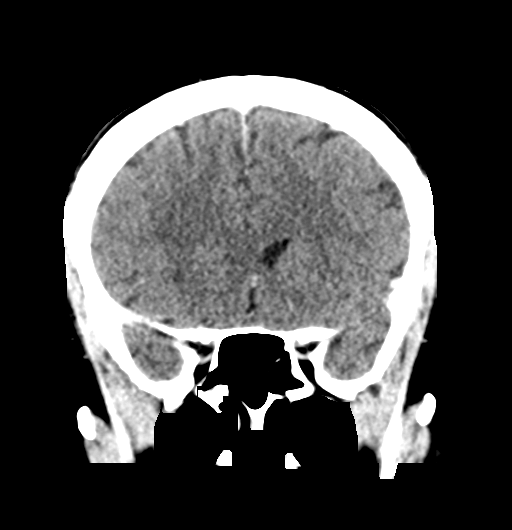
[im 32/71  brain]
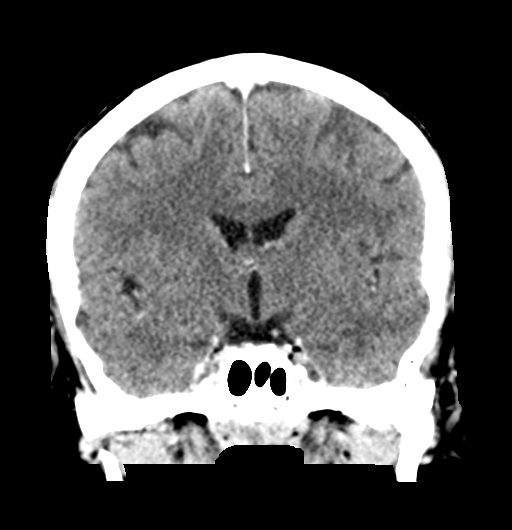
[im 39/71  brain]
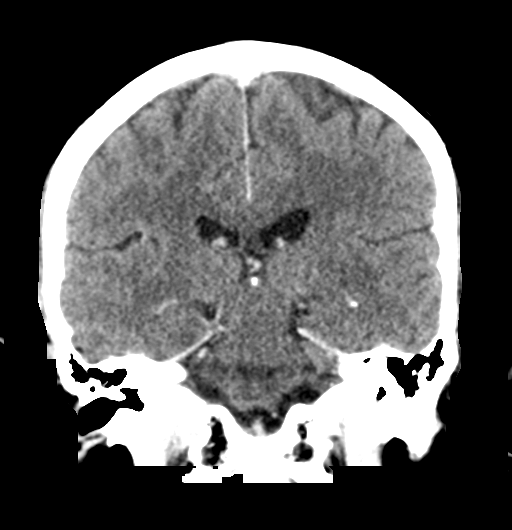

[Series 6: sagittal soft · sagittal · 0.32mm/px · 3 of 57 slices shown]
[im 19/57  brain]
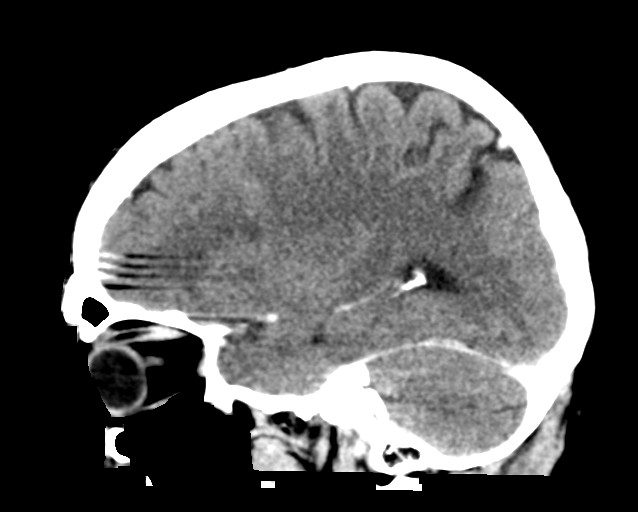
[im 29/57  brain]
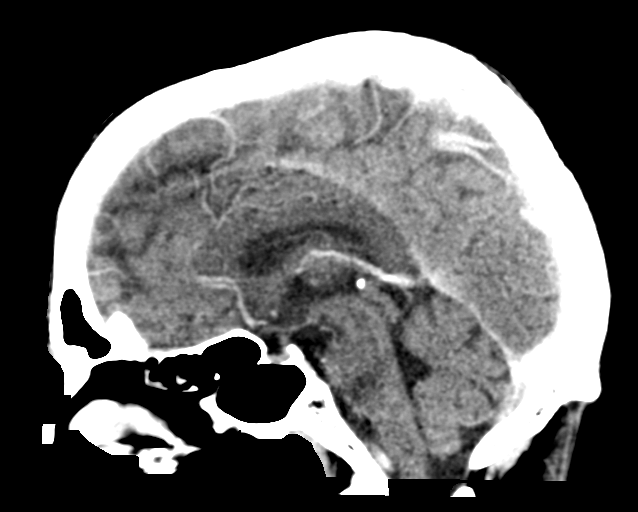
[im 38/57  brain]
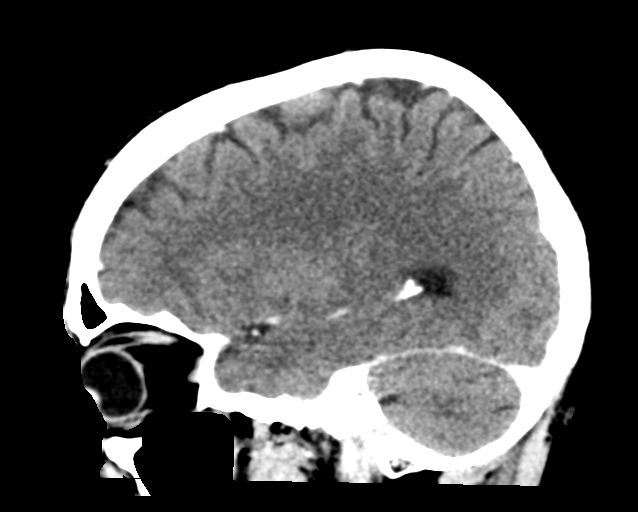

[16 of 47 positions shown; findings below may reference images not displayed]

FINDINGS: Brain: Small to moderate remote right parietal infarct. No acute or
interval infarct. Clear suprasellar cistern. Normal brain volume. No
hydrocephalus or masslike finding.

Vascular: Major vessels are enhancing

Skull: Postoperative right superior orbit.

Sinuses/Orbits: Clear sinuses and unremarkable appearance of the
globes.

Other: Negative
IMPRESSION: 1. No explanation for symptoms.
2. Remote right parietal infarct, also seen in 0050.

## 2022-08-28 ENCOUNTER — Encounter: Payer: Self-pay | Admitting: *Deleted

## 2023-02-05 ENCOUNTER — Encounter: Payer: Self-pay | Admitting: *Deleted

## 2023-07-01 ENCOUNTER — Ambulatory Visit (HOSPITAL_COMMUNITY): Payer: 59 | Admitting: Occupational Therapy

## 2023-07-13 ENCOUNTER — Encounter (HOSPITAL_COMMUNITY): Payer: Self-pay | Admitting: Occupational Therapy

## 2023-07-13 ENCOUNTER — Ambulatory Visit (HOSPITAL_COMMUNITY): Payer: 59 | Attending: Orthopedic Surgery | Admitting: Occupational Therapy

## 2023-07-13 ENCOUNTER — Other Ambulatory Visit: Payer: Self-pay

## 2023-07-13 DIAGNOSIS — M25512 Pain in left shoulder: Secondary | ICD-10-CM | POA: Insufficient documentation

## 2023-07-13 DIAGNOSIS — M25612 Stiffness of left shoulder, not elsewhere classified: Secondary | ICD-10-CM | POA: Diagnosis present

## 2023-07-13 DIAGNOSIS — R29898 Other symptoms and signs involving the musculoskeletal system: Secondary | ICD-10-CM | POA: Insufficient documentation
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
## 2023-07-13 NOTE — Patient Instructions (Signed)

## 2023-07-17 ENCOUNTER — Ambulatory Visit (HOSPITAL_COMMUNITY): Admitting: Occupational Therapy

## 2023-07-17 ENCOUNTER — Encounter (HOSPITAL_COMMUNITY): Payer: Self-pay | Admitting: Occupational Therapy

## 2023-07-17 DIAGNOSIS — M25512 Pain in left shoulder: Secondary | ICD-10-CM

## 2023-07-17 DIAGNOSIS — M25612 Stiffness of left shoulder, not elsewhere classified: Secondary | ICD-10-CM

## 2023-07-17 DIAGNOSIS — R29898 Other symptoms and signs involving the musculoskeletal system: Secondary | ICD-10-CM
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
## 2023-07-17 NOTE — Patient Instructions (Signed)

## 2023-07-20 ENCOUNTER — Encounter (HOSPITAL_COMMUNITY): Payer: Self-pay | Admitting: Occupational Therapy

## 2023-07-20 ENCOUNTER — Ambulatory Visit (HOSPITAL_COMMUNITY): Admitting: Occupational Therapy

## 2023-07-20 DIAGNOSIS — M25512 Pain in left shoulder: Secondary | ICD-10-CM

## 2023-07-20 DIAGNOSIS — M25612 Stiffness of left shoulder, not elsewhere classified: Secondary | ICD-10-CM

## 2023-07-20 DIAGNOSIS — R29898 Other symptoms and signs involving the musculoskeletal system: Secondary | ICD-10-CM
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
## 2023-07-22 ENCOUNTER — Ambulatory Visit (HOSPITAL_COMMUNITY): Admitting: Occupational Therapy

## 2023-07-22 ENCOUNTER — Encounter (HOSPITAL_COMMUNITY): Payer: Self-pay | Admitting: Occupational Therapy

## 2023-07-22 DIAGNOSIS — R29898 Other symptoms and signs involving the musculoskeletal system: Secondary | ICD-10-CM

## 2023-07-22 DIAGNOSIS — M25512 Pain in left shoulder: Secondary | ICD-10-CM

## 2023-07-22 DIAGNOSIS — M25612 Stiffness of left shoulder, not elsewhere classified: Secondary | ICD-10-CM
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
## 2023-07-27 ENCOUNTER — Ambulatory Visit (HOSPITAL_COMMUNITY): Attending: Orthopedic Surgery | Admitting: Occupational Therapy

## 2023-07-27 ENCOUNTER — Encounter (HOSPITAL_COMMUNITY): Payer: Self-pay | Admitting: Occupational Therapy

## 2023-07-27 DIAGNOSIS — M25512 Pain in left shoulder: Secondary | ICD-10-CM | POA: Insufficient documentation

## 2023-07-27 DIAGNOSIS — R29898 Other symptoms and signs involving the musculoskeletal system: Secondary | ICD-10-CM | POA: Diagnosis present

## 2023-07-27 DIAGNOSIS — M25612 Stiffness of left shoulder, not elsewhere classified: Secondary | ICD-10-CM | POA: Insufficient documentation
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
## 2023-07-30 ENCOUNTER — Ambulatory Visit (HOSPITAL_COMMUNITY): Admitting: Occupational Therapy

## 2023-07-30 ENCOUNTER — Encounter (HOSPITAL_COMMUNITY): Payer: Self-pay | Admitting: Occupational Therapy

## 2023-07-30 DIAGNOSIS — M25512 Pain in left shoulder: Secondary | ICD-10-CM | POA: Diagnosis not present

## 2023-07-30 DIAGNOSIS — M25612 Stiffness of left shoulder, not elsewhere classified: Secondary | ICD-10-CM

## 2023-07-30 DIAGNOSIS — R29898 Other symptoms and signs involving the musculoskeletal system: Secondary | ICD-10-CM
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
## 2023-08-03 ENCOUNTER — Encounter (HOSPITAL_COMMUNITY): Payer: Self-pay | Admitting: Occupational Therapy

## 2023-08-03 ENCOUNTER — Ambulatory Visit (HOSPITAL_COMMUNITY): Admitting: Occupational Therapy

## 2023-08-03 ENCOUNTER — Encounter (HOSPITAL_COMMUNITY): Admitting: Occupational Therapy

## 2023-08-03 DIAGNOSIS — M25512 Pain in left shoulder: Secondary | ICD-10-CM

## 2023-08-03 DIAGNOSIS — R29898 Other symptoms and signs involving the musculoskeletal system: Secondary | ICD-10-CM

## 2023-08-03 DIAGNOSIS — M25612 Stiffness of left shoulder, not elsewhere classified: Secondary | ICD-10-CM
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
## 2023-08-06 ENCOUNTER — Encounter (HOSPITAL_COMMUNITY): Admitting: Occupational Therapy

## 2023-08-07 ENCOUNTER — Encounter (HOSPITAL_COMMUNITY): Payer: Self-pay | Admitting: Occupational Therapy

## 2023-08-07 ENCOUNTER — Ambulatory Visit (HOSPITAL_COMMUNITY): Attending: Orthopedic Surgery | Admitting: Occupational Therapy

## 2023-08-07 DIAGNOSIS — R29898 Other symptoms and signs involving the musculoskeletal system: Secondary | ICD-10-CM | POA: Diagnosis present

## 2023-08-07 DIAGNOSIS — M25512 Pain in left shoulder: Secondary | ICD-10-CM | POA: Insufficient documentation

## 2023-08-07 DIAGNOSIS — M25612 Stiffness of left shoulder, not elsewhere classified: Secondary | ICD-10-CM | POA: Diagnosis present
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
## 2023-08-10 ENCOUNTER — Encounter (HOSPITAL_COMMUNITY): Admitting: Occupational Therapy

## 2023-08-12 ENCOUNTER — Encounter (HOSPITAL_COMMUNITY): Payer: Self-pay | Admitting: Occupational Therapy

## 2023-08-12 ENCOUNTER — Ambulatory Visit (HOSPITAL_COMMUNITY): Admitting: Occupational Therapy

## 2023-08-12 DIAGNOSIS — M25512 Pain in left shoulder: Secondary | ICD-10-CM

## 2023-08-12 DIAGNOSIS — R29898 Other symptoms and signs involving the musculoskeletal system: Secondary | ICD-10-CM

## 2023-08-12 DIAGNOSIS — M25612 Stiffness of left shoulder, not elsewhere classified: Secondary | ICD-10-CM
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
## 2023-08-13 ENCOUNTER — Encounter (HOSPITAL_COMMUNITY): Admitting: Occupational Therapy

## 2023-08-14 ENCOUNTER — Ambulatory Visit (HOSPITAL_COMMUNITY): Admitting: Occupational Therapy

## 2023-08-14 ENCOUNTER — Encounter (HOSPITAL_COMMUNITY): Payer: Self-pay | Admitting: Occupational Therapy

## 2023-08-14 DIAGNOSIS — M25512 Pain in left shoulder: Secondary | ICD-10-CM

## 2023-08-14 DIAGNOSIS — R29898 Other symptoms and signs involving the musculoskeletal system: Secondary | ICD-10-CM

## 2023-08-14 DIAGNOSIS — M25612 Stiffness of left shoulder, not elsewhere classified: Secondary | ICD-10-CM
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
## 2023-08-18 ENCOUNTER — Ambulatory Visit (HOSPITAL_COMMUNITY): Attending: Orthopedic Surgery | Admitting: Occupational Therapy

## 2023-08-18 ENCOUNTER — Encounter (HOSPITAL_COMMUNITY): Payer: Self-pay | Admitting: Occupational Therapy

## 2023-08-18 DIAGNOSIS — M25612 Stiffness of left shoulder, not elsewhere classified: Secondary | ICD-10-CM | POA: Diagnosis present

## 2023-08-18 DIAGNOSIS — R29898 Other symptoms and signs involving the musculoskeletal system: Secondary | ICD-10-CM | POA: Insufficient documentation

## 2023-08-18 DIAGNOSIS — M25512 Pain in left shoulder: Secondary | ICD-10-CM | POA: Diagnosis present
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
## 2023-08-20 ENCOUNTER — Ambulatory Visit (HOSPITAL_COMMUNITY): Admitting: Occupational Therapy

## 2023-08-20 ENCOUNTER — Encounter (HOSPITAL_COMMUNITY): Payer: Self-pay | Admitting: Occupational Therapy

## 2023-08-20 DIAGNOSIS — R29898 Other symptoms and signs involving the musculoskeletal system: Secondary | ICD-10-CM

## 2023-08-20 DIAGNOSIS — M25512 Pain in left shoulder: Secondary | ICD-10-CM

## 2023-08-20 DIAGNOSIS — M25612 Stiffness of left shoulder, not elsewhere classified: Secondary | ICD-10-CM
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
## 2023-08-25 ENCOUNTER — Ambulatory Visit (HOSPITAL_COMMUNITY): Admitting: Occupational Therapy

## 2023-08-25 ENCOUNTER — Encounter (HOSPITAL_COMMUNITY): Payer: Self-pay | Admitting: Occupational Therapy

## 2023-08-25 DIAGNOSIS — M25512 Pain in left shoulder: Secondary | ICD-10-CM | POA: Diagnosis not present

## 2023-08-25 DIAGNOSIS — R29898 Other symptoms and signs involving the musculoskeletal system: Secondary | ICD-10-CM

## 2023-08-25 DIAGNOSIS — M25612 Stiffness of left shoulder, not elsewhere classified: Secondary | ICD-10-CM
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
## 2023-08-27 ENCOUNTER — Ambulatory Visit (HOSPITAL_COMMUNITY): Admitting: Occupational Therapy

## 2023-08-27 ENCOUNTER — Encounter (HOSPITAL_COMMUNITY): Payer: Self-pay | Admitting: Occupational Therapy

## 2023-08-27 DIAGNOSIS — M25612 Stiffness of left shoulder, not elsewhere classified: Secondary | ICD-10-CM

## 2023-08-27 DIAGNOSIS — M25512 Pain in left shoulder: Secondary | ICD-10-CM | POA: Diagnosis not present

## 2023-08-27 DIAGNOSIS — R29898 Other symptoms and signs involving the musculoskeletal system: Secondary | ICD-10-CM
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
## 2023-08-31 ENCOUNTER — Encounter (HOSPITAL_COMMUNITY): Payer: Self-pay | Admitting: Occupational Therapy

## 2023-08-31 ENCOUNTER — Ambulatory Visit (HOSPITAL_COMMUNITY): Attending: Orthopedic Surgery | Admitting: Occupational Therapy

## 2023-08-31 DIAGNOSIS — M25512 Pain in left shoulder: Secondary | ICD-10-CM | POA: Insufficient documentation

## 2023-08-31 DIAGNOSIS — R29898 Other symptoms and signs involving the musculoskeletal system: Secondary | ICD-10-CM | POA: Diagnosis present

## 2023-08-31 DIAGNOSIS — M25612 Stiffness of left shoulder, not elsewhere classified: Secondary | ICD-10-CM | POA: Insufficient documentation
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
## 2023-08-31 NOTE — Patient Instructions (Signed)
Side Lying Exercises: Complete 10-15X each, 1-2x/day   1) Sidelying Flexion:   Lie on your side with your affected arm up. Start with the weight in your top hand by your side. Lift the arm forward and pull your shoulder blade down as the arm lifts up (like a seesaw). NO WEIGHT     2) Sidelying abduction:   Lie on your side with your arm down straight at your side.  Raise the arm up and overhead, keeping the elbow straight.  You may turn the palm forward and inward if you can.     3) Sidelying horizontal abduction:   Lie on your side with arm straight up towards ceiling. Lower the arm straight out to face the wall and bring back up towards ceiling.     4) Sidelying internal/external rotation:   Lie on side with elbow bent. Lower and raise forearm in direction of the ceiling, keeping elbow bent and by the side.

## 2023-09-02 ENCOUNTER — Encounter (HOSPITAL_COMMUNITY): Payer: Self-pay | Admitting: Occupational Therapy

## 2023-09-02 ENCOUNTER — Ambulatory Visit (HOSPITAL_COMMUNITY): Admitting: Occupational Therapy

## 2023-09-02 DIAGNOSIS — R29898 Other symptoms and signs involving the musculoskeletal system: Secondary | ICD-10-CM

## 2023-09-02 DIAGNOSIS — M25512 Pain in left shoulder: Secondary | ICD-10-CM | POA: Diagnosis not present

## 2023-09-02 DIAGNOSIS — M25612 Stiffness of left shoulder, not elsewhere classified: Secondary | ICD-10-CM
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
## 2023-09-09 ENCOUNTER — Ambulatory Visit (HOSPITAL_COMMUNITY): Payer: Worker's Compensation | Attending: Orthopedic Surgery | Admitting: Occupational Therapy

## 2023-09-09 ENCOUNTER — Encounter (HOSPITAL_COMMUNITY): Payer: Self-pay | Admitting: Occupational Therapy

## 2023-09-09 DIAGNOSIS — M25512 Pain in left shoulder: Secondary | ICD-10-CM | POA: Diagnosis present

## 2023-09-09 DIAGNOSIS — R29898 Other symptoms and signs involving the musculoskeletal system: Secondary | ICD-10-CM | POA: Diagnosis present

## 2023-09-09 DIAGNOSIS — M25612 Stiffness of left shoulder, not elsewhere classified: Secondary | ICD-10-CM | POA: Diagnosis present
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
## 2023-09-11 ENCOUNTER — Encounter (HOSPITAL_COMMUNITY): Payer: Self-pay | Admitting: Occupational Therapy

## 2023-09-11 ENCOUNTER — Ambulatory Visit (HOSPITAL_COMMUNITY): Payer: Worker's Compensation | Admitting: Occupational Therapy

## 2023-09-11 DIAGNOSIS — M25512 Pain in left shoulder: Secondary | ICD-10-CM

## 2023-09-11 DIAGNOSIS — R29898 Other symptoms and signs involving the musculoskeletal system: Secondary | ICD-10-CM

## 2023-09-11 DIAGNOSIS — M25612 Stiffness of left shoulder, not elsewhere classified: Secondary | ICD-10-CM
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
## 2023-09-14 ENCOUNTER — Encounter (HOSPITAL_COMMUNITY): Payer: Self-pay | Admitting: Occupational Therapy

## 2023-09-14 ENCOUNTER — Ambulatory Visit (HOSPITAL_COMMUNITY): Payer: Worker's Compensation | Admitting: Occupational Therapy

## 2023-09-14 DIAGNOSIS — R29898 Other symptoms and signs involving the musculoskeletal system: Secondary | ICD-10-CM

## 2023-09-14 DIAGNOSIS — M25512 Pain in left shoulder: Secondary | ICD-10-CM

## 2023-09-14 DIAGNOSIS — M25612 Stiffness of left shoulder, not elsewhere classified: Secondary | ICD-10-CM
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
## 2023-09-17 ENCOUNTER — Ambulatory Visit (HOSPITAL_COMMUNITY): Payer: Worker's Compensation | Admitting: Occupational Therapy

## 2023-09-17 ENCOUNTER — Encounter (HOSPITAL_COMMUNITY): Payer: Self-pay | Admitting: Occupational Therapy

## 2023-09-17 DIAGNOSIS — R29898 Other symptoms and signs involving the musculoskeletal system: Secondary | ICD-10-CM

## 2023-09-17 DIAGNOSIS — M25612 Stiffness of left shoulder, not elsewhere classified: Secondary | ICD-10-CM

## 2023-09-17 DIAGNOSIS — M25512 Pain in left shoulder: Secondary | ICD-10-CM | POA: Diagnosis not present
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
## 2023-09-21 ENCOUNTER — Encounter (HOSPITAL_COMMUNITY): Payer: Self-pay | Admitting: Occupational Therapy

## 2023-09-21 ENCOUNTER — Ambulatory Visit (HOSPITAL_COMMUNITY): Payer: Worker's Compensation | Admitting: Occupational Therapy

## 2023-09-21 DIAGNOSIS — R29898 Other symptoms and signs involving the musculoskeletal system: Secondary | ICD-10-CM

## 2023-09-21 DIAGNOSIS — M25512 Pain in left shoulder: Secondary | ICD-10-CM

## 2023-09-21 DIAGNOSIS — M25612 Stiffness of left shoulder, not elsewhere classified: Secondary | ICD-10-CM
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
## 2023-09-24 ENCOUNTER — Encounter (HOSPITAL_COMMUNITY): Payer: Self-pay | Admitting: Occupational Therapy

## 2023-09-24 ENCOUNTER — Ambulatory Visit (HOSPITAL_COMMUNITY): Attending: Orthopedic Surgery | Admitting: Occupational Therapy

## 2023-09-24 DIAGNOSIS — M25612 Stiffness of left shoulder, not elsewhere classified: Secondary | ICD-10-CM | POA: Insufficient documentation

## 2023-09-24 DIAGNOSIS — M25512 Pain in left shoulder: Secondary | ICD-10-CM | POA: Insufficient documentation

## 2023-09-24 DIAGNOSIS — R29898 Other symptoms and signs involving the musculoskeletal system: Secondary | ICD-10-CM | POA: Diagnosis present
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
## 2023-09-29 ENCOUNTER — Ambulatory Visit (HOSPITAL_COMMUNITY): Attending: Orthopedic Surgery | Admitting: Occupational Therapy

## 2023-09-29 ENCOUNTER — Encounter (HOSPITAL_COMMUNITY): Payer: Self-pay | Admitting: Occupational Therapy

## 2023-09-29 DIAGNOSIS — M25512 Pain in left shoulder: Secondary | ICD-10-CM | POA: Diagnosis present

## 2023-09-29 DIAGNOSIS — M25612 Stiffness of left shoulder, not elsewhere classified: Secondary | ICD-10-CM | POA: Diagnosis present

## 2023-09-29 DIAGNOSIS — R29898 Other symptoms and signs involving the musculoskeletal system: Secondary | ICD-10-CM | POA: Insufficient documentation
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
## 2023-10-02 ENCOUNTER — Ambulatory Visit (HOSPITAL_COMMUNITY): Admitting: Occupational Therapy

## 2023-10-02 ENCOUNTER — Encounter (HOSPITAL_COMMUNITY): Payer: Self-pay | Admitting: Occupational Therapy

## 2023-10-02 DIAGNOSIS — R29898 Other symptoms and signs involving the musculoskeletal system: Secondary | ICD-10-CM

## 2023-10-02 DIAGNOSIS — M25512 Pain in left shoulder: Secondary | ICD-10-CM

## 2023-10-02 DIAGNOSIS — M25612 Stiffness of left shoulder, not elsewhere classified: Secondary | ICD-10-CM
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
## 2023-10-07 ENCOUNTER — Ambulatory Visit (HOSPITAL_COMMUNITY): Attending: Orthopedic Surgery | Admitting: Occupational Therapy

## 2023-10-07 ENCOUNTER — Encounter (HOSPITAL_COMMUNITY): Payer: Self-pay | Admitting: Occupational Therapy

## 2023-10-07 DIAGNOSIS — M25612 Stiffness of left shoulder, not elsewhere classified: Secondary | ICD-10-CM | POA: Diagnosis present

## 2023-10-07 DIAGNOSIS — R29898 Other symptoms and signs involving the musculoskeletal system: Secondary | ICD-10-CM | POA: Insufficient documentation

## 2023-10-07 DIAGNOSIS — M25512 Pain in left shoulder: Secondary | ICD-10-CM | POA: Insufficient documentation
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
## 2023-10-09 ENCOUNTER — Encounter (HOSPITAL_COMMUNITY): Payer: Self-pay | Admitting: Occupational Therapy

## 2023-10-09 ENCOUNTER — Ambulatory Visit (HOSPITAL_COMMUNITY): Admitting: Occupational Therapy

## 2023-10-09 DIAGNOSIS — R29898 Other symptoms and signs involving the musculoskeletal system: Secondary | ICD-10-CM

## 2023-10-09 DIAGNOSIS — M25512 Pain in left shoulder: Secondary | ICD-10-CM

## 2023-10-09 DIAGNOSIS — M25612 Stiffness of left shoulder, not elsewhere classified: Secondary | ICD-10-CM
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
## 2023-10-13 ENCOUNTER — Ambulatory Visit (HOSPITAL_COMMUNITY): Admitting: Occupational Therapy

## 2023-10-13 ENCOUNTER — Encounter (HOSPITAL_COMMUNITY): Payer: Self-pay | Admitting: Occupational Therapy

## 2023-10-13 DIAGNOSIS — R29898 Other symptoms and signs involving the musculoskeletal system: Secondary | ICD-10-CM

## 2023-10-13 DIAGNOSIS — M25512 Pain in left shoulder: Secondary | ICD-10-CM | POA: Diagnosis not present

## 2023-10-13 DIAGNOSIS — M25612 Stiffness of left shoulder, not elsewhere classified: Secondary | ICD-10-CM
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
## 2023-10-15 ENCOUNTER — Other Ambulatory Visit (HOSPITAL_COMMUNITY): Payer: Self-pay | Admitting: Internal Medicine

## 2023-10-15 DIAGNOSIS — F17210 Nicotine dependence, cigarettes, uncomplicated: Secondary | ICD-10-CM

## 2023-10-16 ENCOUNTER — Encounter (HOSPITAL_COMMUNITY): Payer: Self-pay | Admitting: Occupational Therapy

## 2023-10-16 ENCOUNTER — Ambulatory Visit (HOSPITAL_COMMUNITY): Admitting: Occupational Therapy

## 2023-10-16 DIAGNOSIS — M25512 Pain in left shoulder: Secondary | ICD-10-CM | POA: Diagnosis not present

## 2023-10-16 DIAGNOSIS — M25612 Stiffness of left shoulder, not elsewhere classified: Secondary | ICD-10-CM

## 2023-10-16 DIAGNOSIS — R29898 Other symptoms and signs involving the musculoskeletal system: Secondary | ICD-10-CM
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
## 2023-10-16 NOTE — Progress Notes (Signed)
 Left shoulder f/u p/o DOS: 06/01/23 PROCEDURES PERFORMED: 1. Arthroscopic revision rotator cuff repair, massive rotator cuff tear. 2. Arthroscopic subacromial decompression with removal of spur. 3.  Arthroscopic major glenohumeral joint debridement, including labrum, synovial tissue, unstable cartilage flaps. Progress with PT- 2x weekly Going good patient stated

## 2023-10-20 ENCOUNTER — Encounter (HOSPITAL_COMMUNITY): Payer: Self-pay | Admitting: Occupational Therapy

## 2023-10-20 ENCOUNTER — Ambulatory Visit (HOSPITAL_COMMUNITY): Payer: Worker's Compensation | Attending: Orthopedic Surgery | Admitting: Occupational Therapy

## 2023-10-20 DIAGNOSIS — R29898 Other symptoms and signs involving the musculoskeletal system: Secondary | ICD-10-CM | POA: Diagnosis present

## 2023-10-20 DIAGNOSIS — M25512 Pain in left shoulder: Secondary | ICD-10-CM | POA: Insufficient documentation

## 2023-10-20 DIAGNOSIS — M25612 Stiffness of left shoulder, not elsewhere classified: Secondary | ICD-10-CM | POA: Insufficient documentation
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
## 2023-10-28 ENCOUNTER — Encounter (HOSPITAL_COMMUNITY): Payer: Self-pay | Admitting: Occupational Therapy

## 2023-10-28 ENCOUNTER — Ambulatory Visit (HOSPITAL_COMMUNITY): Attending: Orthopedic Surgery | Admitting: Occupational Therapy

## 2023-10-28 DIAGNOSIS — M25612 Stiffness of left shoulder, not elsewhere classified: Secondary | ICD-10-CM | POA: Diagnosis present

## 2023-10-28 DIAGNOSIS — R29898 Other symptoms and signs involving the musculoskeletal system: Secondary | ICD-10-CM | POA: Insufficient documentation

## 2023-10-28 DIAGNOSIS — M25512 Pain in left shoulder: Secondary | ICD-10-CM | POA: Diagnosis present
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
## 2023-10-29 ENCOUNTER — Encounter (INDEPENDENT_AMBULATORY_CARE_PROVIDER_SITE_OTHER): Payer: Self-pay | Admitting: *Deleted

## 2023-10-29 NOTE — Progress Notes (Signed)
 Subjective:   Adrian Lyons is a 53 y.o. male who presents today 4.5 months S/P left shoulder massive revision RCR.  He reports experiencing increased intermittent pain in his left shoulder. Approximately 3-4 weeks ago, he fell backwards out of a chair and hit a steel table with the back of his hand. Although he did not experience immediate discomfort, noted severe pain began during a card game two days later. The pain is described as a stabbing sensation, particularly when reaching over that resolves on its own and is intermittent. He also reports a popping sensation in his shoulder, which is followed by a return to full range of motion. Pain management includes naproxen which he finds effective.   The pain is located at the top of the biceps extending to the outside of the shoulder, and occasionally radiates down the back of his arm. He also reports a sensation of weakness and a decrease in range of motion when the pain is present. Pain will subside on its own. He has been attending physical therapy sessions twice a week. He has not returned to work. He reports no neck pain.   LCV 08/18/2023: Adrian Lyons is a 53 y.o. male who presents today 11 weeks S/P left shoulder massive revision RCR. Overall reports he is doing okay. He reports pain rated 3-4/10.  He is attending OT twice a week and he feels he is slowly making progress. He has been completing home exercises. He denies any injuries or falls.  He is taking naproxen for pain/inflammation. He has not returned to work yet.  Left shoulder  DOS: 06/01/23 PROCEDURES PERFORMED: 1. Arthroscopic revision rotator cuff repair, massive rotator cuff tear. 2. Arthroscopic subacromial decompression with removal of spur. 3.  Arthroscopic major glenohumeral joint debridement, including labrum, synovial tissue, unstable cartilage flaps.     Objective:  Physical Exam Vitals BP (!) 127/92 Comment: recheck BP  Pulse 88   Wt 90.5 kg (199 lb 8.3 oz)   BMI  31.25 kg/m   CONSTITUTIONAL: Appears well developed, no acute distress  HEAD: Normocephalic atraumatic  EYES: extra-ocular movements are intact  NECK: Supple  CHEST/RESPIRATORY: Symmetric expansion of the chest, no intercostal retraction  CARDIOVASCULAR: Warm perfused digits, Pulses palpable at the wrist, temperature warm, refill <2secs  ABDOMEN/GASTROINTESTINAL Soft non tender abdomen  NEUROLOGIC EXAM: Alert oriented x 3 with normal coordination  PSYCHIATRIC: Appropriate mood and affect for his age  SKIN: No erythema, no discoloration, no open draining wounds   Allergies  Allergen Reactions  . Penicillins Anaphylaxis and Itching    Has patient had a PCN reaction causing immediate rash, facial/tongue/throat swelling, SOB or lightheadedness with hypotension: NO, Has patient had a PCN reaction causing severe rash involving mucus membranes or skin necrosis: NO, Has patient had a PCN reaction that required hospitalization: YES, Has patient had a PCN reaction occurring within the last 10 years: NO, If all of the above answers are NO, then may proceed with Cephalosporin use.  . Codeine Itching  . Hydrocodone -Acetaminophen  GI Intolerance   Left shoulder:  Incisions well healed NTTP throughout shoulder No ecchymosis and soft tissue swelling Normal soft tissue contour sensation to light touch in all peripheral nervous distributions Brisk capillary refill and well perfused ROM   FF: 160  ER: 30  IR: L4  Abd: 160 Strength  SS: 4/5  IS: 4/5  SubS: 4/5  Assessment:     ICD-10-CM   1. S/P left rotator cuff repair  Z98.890      Plan:  4.5 months S/P left massive revision RCR. Increased intermittent painful popping for last 4 weeks. On exam he has decent range of motion without deficits compared to last exam. Strength is still lacking but not any weaker than last exam either. Clinically feel rotator cuff is still intact. Unsure what exactly the popping is arising from but will  continue to monitor. We can continue PT once a week and continue HEP. We will continue current work restrictions at this time, not cleared for work. Re evaluate in 3-4 weeks.

## 2023-11-04 ENCOUNTER — Ambulatory Visit (HOSPITAL_COMMUNITY): Attending: Orthopedic Surgery | Admitting: Occupational Therapy

## 2023-11-04 ENCOUNTER — Encounter (HOSPITAL_COMMUNITY): Payer: Self-pay | Admitting: Occupational Therapy

## 2023-11-04 DIAGNOSIS — M25612 Stiffness of left shoulder, not elsewhere classified: Secondary | ICD-10-CM | POA: Diagnosis present

## 2023-11-04 DIAGNOSIS — R29898 Other symptoms and signs involving the musculoskeletal system: Secondary | ICD-10-CM | POA: Diagnosis present

## 2023-11-04 DIAGNOSIS — M25512 Pain in left shoulder: Secondary | ICD-10-CM | POA: Insufficient documentation
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
## 2023-11-05 ENCOUNTER — Ambulatory Visit (HOSPITAL_COMMUNITY): Admission: RE | Admit: 2023-11-05 | Payer: Self-pay | Source: Ambulatory Visit

## 2023-11-05 ENCOUNTER — Encounter (HOSPITAL_COMMUNITY): Payer: Self-pay

## 2023-11-11 ENCOUNTER — Encounter (HOSPITAL_COMMUNITY): Payer: Self-pay | Admitting: Occupational Therapy

## 2023-11-11 ENCOUNTER — Ambulatory Visit (HOSPITAL_COMMUNITY)
Admission: RE | Admit: 2023-11-11 | Discharge: 2023-11-11 | Disposition: A | Discharge status: Home / Self Care | Source: Ambulatory Visit | Attending: Internal Medicine | Admitting: Internal Medicine

## 2023-11-11 ENCOUNTER — Ambulatory Visit (HOSPITAL_COMMUNITY): Attending: Orthopedic Surgery | Admitting: Occupational Therapy

## 2023-11-11 DIAGNOSIS — M25512 Pain in left shoulder: Secondary | ICD-10-CM | POA: Insufficient documentation

## 2023-11-11 DIAGNOSIS — R29898 Other symptoms and signs involving the musculoskeletal system: Secondary | ICD-10-CM | POA: Insufficient documentation

## 2023-11-11 DIAGNOSIS — F17210 Nicotine dependence, cigarettes, uncomplicated: Secondary | ICD-10-CM | POA: Insufficient documentation

## 2023-11-11 DIAGNOSIS — M25612 Stiffness of left shoulder, not elsewhere classified: Secondary | ICD-10-CM | POA: Diagnosis present
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
## 2023-11-19 ENCOUNTER — Ambulatory Visit (HOSPITAL_COMMUNITY): Admitting: Occupational Therapy

## 2023-11-19 ENCOUNTER — Encounter (HOSPITAL_COMMUNITY): Payer: Self-pay | Admitting: Occupational Therapy

## 2023-11-19 DIAGNOSIS — M25512 Pain in left shoulder: Secondary | ICD-10-CM | POA: Diagnosis not present

## 2023-11-19 DIAGNOSIS — R29898 Other symptoms and signs involving the musculoskeletal system: Secondary | ICD-10-CM

## 2023-11-19 DIAGNOSIS — M25612 Stiffness of left shoulder, not elsewhere classified: Secondary | ICD-10-CM
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
## 2023-11-25 ENCOUNTER — Ambulatory Visit (HOSPITAL_COMMUNITY): Attending: Orthopedic Surgery | Admitting: Occupational Therapy

## 2023-11-25 ENCOUNTER — Encounter (HOSPITAL_COMMUNITY): Payer: Self-pay | Admitting: Occupational Therapy

## 2023-11-25 DIAGNOSIS — M25512 Pain in left shoulder: Secondary | ICD-10-CM | POA: Insufficient documentation

## 2023-11-25 DIAGNOSIS — M25612 Stiffness of left shoulder, not elsewhere classified: Secondary | ICD-10-CM | POA: Insufficient documentation

## 2023-11-25 DIAGNOSIS — R29898 Other symptoms and signs involving the musculoskeletal system: Secondary | ICD-10-CM | POA: Diagnosis present
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
## 2023-12-03 ENCOUNTER — Ambulatory Visit (HOSPITAL_COMMUNITY): Admitting: Occupational Therapy

## 2023-12-03 ENCOUNTER — Encounter (HOSPITAL_COMMUNITY): Payer: Self-pay | Admitting: Occupational Therapy

## 2023-12-03 DIAGNOSIS — M25512 Pain in left shoulder: Secondary | ICD-10-CM | POA: Diagnosis not present

## 2023-12-03 DIAGNOSIS — R29898 Other symptoms and signs involving the musculoskeletal system: Secondary | ICD-10-CM

## 2023-12-03 DIAGNOSIS — M25612 Stiffness of left shoulder, not elsewhere classified: Secondary | ICD-10-CM
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
## 2023-12-09 ENCOUNTER — Encounter (HOSPITAL_COMMUNITY): Payer: Self-pay | Admitting: Occupational Therapy

## 2023-12-09 ENCOUNTER — Ambulatory Visit (HOSPITAL_COMMUNITY): Payer: Self-pay | Attending: Orthopedic Surgery | Admitting: Occupational Therapy

## 2023-12-09 DIAGNOSIS — M25612 Stiffness of left shoulder, not elsewhere classified: Secondary | ICD-10-CM | POA: Diagnosis present

## 2023-12-09 DIAGNOSIS — M25512 Pain in left shoulder: Secondary | ICD-10-CM | POA: Diagnosis present

## 2023-12-09 DIAGNOSIS — R29898 Other symptoms and signs involving the musculoskeletal system: Secondary | ICD-10-CM | POA: Diagnosis present
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
## 2023-12-17 ENCOUNTER — Encounter (HOSPITAL_COMMUNITY): Payer: Self-pay | Admitting: Occupational Therapy

## 2023-12-17 ENCOUNTER — Ambulatory Visit (HOSPITAL_COMMUNITY): Payer: Self-pay | Admitting: Occupational Therapy

## 2023-12-17 DIAGNOSIS — R29898 Other symptoms and signs involving the musculoskeletal system: Secondary | ICD-10-CM

## 2023-12-17 DIAGNOSIS — M25612 Stiffness of left shoulder, not elsewhere classified: Secondary | ICD-10-CM

## 2023-12-17 DIAGNOSIS — M25512 Pain in left shoulder: Secondary | ICD-10-CM
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
## 2023-12-24 ENCOUNTER — Ambulatory Visit (HOSPITAL_COMMUNITY): Attending: Orthopedic Surgery | Admitting: Occupational Therapy

## 2023-12-24 ENCOUNTER — Encounter (HOSPITAL_COMMUNITY): Payer: Self-pay | Admitting: Occupational Therapy

## 2023-12-24 DIAGNOSIS — R29898 Other symptoms and signs involving the musculoskeletal system: Secondary | ICD-10-CM | POA: Diagnosis present

## 2023-12-24 DIAGNOSIS — M25512 Pain in left shoulder: Secondary | ICD-10-CM | POA: Diagnosis present

## 2023-12-24 DIAGNOSIS — M25612 Stiffness of left shoulder, not elsewhere classified: Secondary | ICD-10-CM | POA: Diagnosis present
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
## 2023-12-31 ENCOUNTER — Ambulatory Visit (HOSPITAL_COMMUNITY): Payer: Self-pay | Attending: Orthopedic Surgery | Admitting: Occupational Therapy

## 2023-12-31 ENCOUNTER — Encounter (HOSPITAL_COMMUNITY): Payer: Self-pay | Admitting: Occupational Therapy

## 2023-12-31 DIAGNOSIS — R29898 Other symptoms and signs involving the musculoskeletal system: Secondary | ICD-10-CM | POA: Insufficient documentation

## 2023-12-31 DIAGNOSIS — M25612 Stiffness of left shoulder, not elsewhere classified: Secondary | ICD-10-CM | POA: Diagnosis present

## 2023-12-31 DIAGNOSIS — M25512 Pain in left shoulder: Secondary | ICD-10-CM | POA: Diagnosis present
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
## 2024-01-06 ENCOUNTER — Encounter (HOSPITAL_COMMUNITY): Payer: Self-pay | Admitting: Occupational Therapy

## 2024-01-06 ENCOUNTER — Ambulatory Visit (HOSPITAL_COMMUNITY): Attending: Orthopedic Surgery | Admitting: Occupational Therapy

## 2024-01-06 DIAGNOSIS — R29898 Other symptoms and signs involving the musculoskeletal system: Secondary | ICD-10-CM | POA: Insufficient documentation

## 2024-01-06 DIAGNOSIS — M25612 Stiffness of left shoulder, not elsewhere classified: Secondary | ICD-10-CM | POA: Diagnosis present

## 2024-01-06 DIAGNOSIS — M25512 Pain in left shoulder: Secondary | ICD-10-CM | POA: Diagnosis present
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
## 2024-01-06 NOTE — Patient Instructions (Signed)

## 2024-01-14 ENCOUNTER — Encounter (HOSPITAL_COMMUNITY): Payer: Self-pay | Admitting: Occupational Therapy

## 2024-01-14 ENCOUNTER — Ambulatory Visit (HOSPITAL_COMMUNITY): Attending: Orthopedic Surgery | Admitting: Occupational Therapy

## 2024-01-14 DIAGNOSIS — R29898 Other symptoms and signs involving the musculoskeletal system: Secondary | ICD-10-CM | POA: Diagnosis present

## 2024-01-14 DIAGNOSIS — M25612 Stiffness of left shoulder, not elsewhere classified: Secondary | ICD-10-CM | POA: Insufficient documentation

## 2024-01-14 DIAGNOSIS — M25512 Pain in left shoulder: Secondary | ICD-10-CM | POA: Diagnosis present
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
## 2024-01-19 ENCOUNTER — Ambulatory Visit (HOSPITAL_COMMUNITY): Payer: Self-pay | Attending: Internal Medicine | Admitting: Occupational Therapy

## 2024-01-19 ENCOUNTER — Encounter (HOSPITAL_COMMUNITY): Payer: Self-pay | Admitting: Occupational Therapy

## 2024-01-19 DIAGNOSIS — R29898 Other symptoms and signs involving the musculoskeletal system: Secondary | ICD-10-CM | POA: Diagnosis present

## 2024-01-19 DIAGNOSIS — M25612 Stiffness of left shoulder, not elsewhere classified: Secondary | ICD-10-CM | POA: Diagnosis present

## 2024-01-19 DIAGNOSIS — M25512 Pain in left shoulder: Secondary | ICD-10-CM | POA: Insufficient documentation
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
## 2024-05-02 ENCOUNTER — Encounter (INDEPENDENT_AMBULATORY_CARE_PROVIDER_SITE_OTHER): Payer: Self-pay | Admitting: *Deleted
